# Patient Record
Sex: Male | Born: 1955
Health system: Southern US, Community
[De-identification: ages and names within clinical notes are randomized; demographics above are authoritative.]

## PROBLEM LIST (undated history)

## (undated) DIAGNOSIS — I2510 Atherosclerotic heart disease of native coronary artery without angina pectoris: Secondary | ICD-10-CM

## (undated) DIAGNOSIS — I5030 Unspecified diastolic (congestive) heart failure: Secondary | ICD-10-CM

## (undated) DIAGNOSIS — I700 Atherosclerosis of aorta: Secondary | ICD-10-CM

## (undated) DIAGNOSIS — I4819 Other persistent atrial fibrillation: Secondary | ICD-10-CM

## (undated) DIAGNOSIS — E785 Hyperlipidemia, unspecified: Secondary | ICD-10-CM

## (undated) DIAGNOSIS — I10 Essential (primary) hypertension: Secondary | ICD-10-CM

## (undated) DIAGNOSIS — R7303 Prediabetes: Secondary | ICD-10-CM

## (undated) HISTORY — DX: Unspecified diastolic (congestive) heart failure: I50.30

## (undated) HISTORY — PX: NO PAST SURGERIES: SHX2092

## (undated) HISTORY — DX: Atherosclerosis of aorta: I70.0

## (undated) HISTORY — DX: Prediabetes: R73.03

## (undated) HISTORY — PX: OTHER SURGICAL HISTORY: SHX169

## (undated) HISTORY — DX: Atherosclerotic heart disease of native coronary artery without angina pectoris: I25.10

## (undated) HISTORY — DX: Other persistent atrial fibrillation: I48.19

## (undated) HISTORY — DX: Hyperlipidemia, unspecified: E78.5

---

## 2013-09-11 DIAGNOSIS — Z8673 Personal history of transient ischemic attack (TIA), and cerebral infarction without residual deficits: Secondary | ICD-10-CM

## 2013-09-11 HISTORY — DX: Personal history of transient ischemic attack (TIA), and cerebral infarction without residual deficits: Z86.73

## 2014-09-06 ENCOUNTER — Encounter (HOSPITAL_COMMUNITY): Payer: Self-pay | Admitting: Radiology

## 2014-09-06 ENCOUNTER — Emergency Department (HOSPITAL_COMMUNITY): Payer: BC Managed Care – PPO

## 2014-09-06 ENCOUNTER — Inpatient Hospital Stay (HOSPITAL_COMMUNITY)
Admission: EM | Admit: 2014-09-06 | Discharge: 2014-09-07 | DRG: 069 | Disposition: A | Discharge status: Home / Self Care | Payer: BC Managed Care – PPO | Attending: Internal Medicine | Admitting: Internal Medicine

## 2014-09-06 ENCOUNTER — Inpatient Hospital Stay (HOSPITAL_COMMUNITY): Payer: BC Managed Care – PPO

## 2014-09-06 DIAGNOSIS — R29810 Facial weakness: Secondary | ICD-10-CM | POA: Diagnosis present

## 2014-09-06 DIAGNOSIS — G459 Transient cerebral ischemic attack, unspecified: Principal | ICD-10-CM | POA: Diagnosis present

## 2014-09-06 DIAGNOSIS — G450 Vertebro-basilar artery syndrome: Secondary | ICD-10-CM | POA: Diagnosis not present

## 2014-09-06 DIAGNOSIS — I639 Cerebral infarction, unspecified: Secondary | ICD-10-CM

## 2014-09-06 DIAGNOSIS — I10 Essential (primary) hypertension: Secondary | ICD-10-CM | POA: Diagnosis present

## 2014-09-06 DIAGNOSIS — R059 Cough, unspecified: Secondary | ICD-10-CM

## 2014-09-06 DIAGNOSIS — Z7982 Long term (current) use of aspirin: Secondary | ICD-10-CM | POA: Diagnosis not present

## 2014-09-06 DIAGNOSIS — R42 Dizziness and giddiness: Secondary | ICD-10-CM

## 2014-09-06 DIAGNOSIS — G4700 Insomnia, unspecified: Secondary | ICD-10-CM | POA: Diagnosis present

## 2014-09-06 DIAGNOSIS — J069 Acute upper respiratory infection, unspecified: Secondary | ICD-10-CM | POA: Diagnosis present

## 2014-09-06 DIAGNOSIS — E663 Overweight: Secondary | ICD-10-CM | POA: Diagnosis present

## 2014-09-06 DIAGNOSIS — Z6829 Body mass index (BMI) 29.0-29.9, adult: Secondary | ICD-10-CM

## 2014-09-06 DIAGNOSIS — E785 Hyperlipidemia, unspecified: Secondary | ICD-10-CM | POA: Diagnosis present

## 2014-09-06 DIAGNOSIS — H8110 Benign paroxysmal vertigo, unspecified ear: Secondary | ICD-10-CM | POA: Diagnosis present

## 2014-09-06 DIAGNOSIS — E876 Hypokalemia: Secondary | ICD-10-CM | POA: Diagnosis present

## 2014-09-06 DIAGNOSIS — I5032 Chronic diastolic (congestive) heart failure: Secondary | ICD-10-CM | POA: Diagnosis present

## 2014-09-06 DIAGNOSIS — R05 Cough: Secondary | ICD-10-CM

## 2014-09-06 HISTORY — DX: Essential (primary) hypertension: I10

## 2014-09-06 LAB — DIFFERENTIAL
BASOS ABS: 0 10*3/uL (ref 0.0–0.1)
Basophils Relative: 0 % (ref 0–1)
EOS PCT: 6 % — AB (ref 0–5)
Eosinophils Absolute: 0.7 10*3/uL (ref 0.0–0.7)
LYMPHS ABS: 4.4 10*3/uL — AB (ref 0.7–4.0)
Lymphocytes Relative: 39 % (ref 12–46)
MONOS PCT: 9 % (ref 3–12)
Monocytes Absolute: 1 10*3/uL (ref 0.1–1.0)
NEUTROS ABS: 5.1 10*3/uL (ref 1.7–7.7)
Neutrophils Relative %: 46 % (ref 43–77)

## 2014-09-06 LAB — CBC
HCT: 45.4 % (ref 39.0–52.0)
Hemoglobin: 15.3 g/dL (ref 13.0–17.0)
MCH: 29.3 pg (ref 26.0–34.0)
MCHC: 33.7 g/dL (ref 30.0–36.0)
MCV: 86.8 fL (ref 78.0–100.0)
Platelets: 233 10*3/uL (ref 150–400)
RBC: 5.23 MIL/uL (ref 4.22–5.81)
RDW: 14.1 % (ref 11.5–15.5)
WBC: 11.2 10*3/uL — AB (ref 4.0–10.5)

## 2014-09-06 LAB — I-STAT CHEM 8, ED
BUN: 25 mg/dL — ABNORMAL HIGH (ref 6–23)
CREATININE: 1.1 mg/dL (ref 0.50–1.35)
Calcium, Ion: 1.17 mmol/L (ref 1.12–1.23)
Chloride: 102 mEq/L (ref 96–112)
Glucose, Bld: 164 mg/dL — ABNORMAL HIGH (ref 70–99)
HCT: 49 % (ref 39.0–52.0)
Hemoglobin: 16.7 g/dL (ref 13.0–17.0)
Potassium: 3 mmol/L — ABNORMAL LOW (ref 3.5–5.1)
Sodium: 141 mmol/L (ref 135–145)
TCO2: 22 mmol/L (ref 0–100)

## 2014-09-06 LAB — COMPREHENSIVE METABOLIC PANEL
ALT: 29 U/L (ref 0–53)
ANION GAP: 13 (ref 5–15)
AST: 36 U/L (ref 0–37)
Albumin: 4.5 g/dL (ref 3.5–5.2)
Alkaline Phosphatase: 66 U/L (ref 39–117)
BILIRUBIN TOTAL: 0.6 mg/dL (ref 0.3–1.2)
BUN: 20 mg/dL (ref 6–23)
CHLORIDE: 105 meq/L (ref 96–112)
CO2: 20 mmol/L (ref 19–32)
CREATININE: 1.19 mg/dL (ref 0.50–1.35)
Calcium: 9.7 mg/dL (ref 8.4–10.5)
GFR, EST AFRICAN AMERICAN: 76 mL/min — AB (ref >90)
GFR, EST NON AFRICAN AMERICAN: 65 mL/min — AB (ref >90)
GLUCOSE: 165 mg/dL — AB (ref 70–99)
Potassium: 3.2 mmol/L — ABNORMAL LOW (ref 3.5–5.1)
Sodium: 138 mmol/L (ref 135–145)
Total Protein: 6.7 g/dL (ref 6.0–8.3)

## 2014-09-06 LAB — I-STAT TROPONIN, ED: Troponin i, poc: 0 ng/mL (ref 0.00–0.08)

## 2014-09-06 LAB — URINALYSIS, ROUTINE W REFLEX MICROSCOPIC
Bilirubin Urine: NEGATIVE
GLUCOSE, UA: NEGATIVE mg/dL
Hgb urine dipstick: NEGATIVE
Ketones, ur: NEGATIVE mg/dL
Leukocytes, UA: NEGATIVE
Nitrite: NEGATIVE
Protein, ur: NEGATIVE mg/dL
UROBILINOGEN UA: 0.2 mg/dL (ref 0.0–1.0)
pH: 7 (ref 5.0–8.0)

## 2014-09-06 LAB — RAPID URINE DRUG SCREEN, HOSP PERFORMED
Amphetamines: NOT DETECTED
Barbiturates: NOT DETECTED
Benzodiazepines: NOT DETECTED
Cocaine: NOT DETECTED
Opiates: NOT DETECTED
Tetrahydrocannabinol: NOT DETECTED

## 2014-09-06 LAB — TROPONIN I: Troponin I: <0.03 ng/mL (ref <0.031)

## 2014-09-06 LAB — PROTIME-INR
INR: 0.94 (ref 0.00–1.49)
Prothrombin Time: 12.7 seconds (ref 11.6–15.2)

## 2014-09-06 LAB — ETHANOL

## 2014-09-06 LAB — APTT: aPTT: 26 seconds (ref 24–37)

## 2014-09-06 MED ORDER — POTASSIUM CHLORIDE 10 MEQ/100ML IV SOLN
10.0000 meq | INTRAVENOUS | Status: AC
  Administered 2014-09-06 (×3): 10 meq via INTRAVENOUS
  Filled 2014-09-06 (×3): qty 100

## 2014-09-06 MED ORDER — ALTEPLASE (STROKE) FULL DOSE INFUSION
90.0000 mg | Freq: Once | INTRAVENOUS | Status: DC
  Filled 2014-09-06: qty 90

## 2014-09-06 MED ORDER — ATORVASTATIN CALCIUM 10 MG PO TABS
10.0000 mg | ORAL_TABLET | Freq: Every day | ORAL | Status: DC
  Administered 2014-09-06 – 2014-09-07 (×2): 10 mg via ORAL
  Filled 2014-09-06 (×2): qty 1

## 2014-09-06 MED ORDER — IOHEXOL 350 MG/ML SOLN: 100.0000 mL | Freq: Once | INTRAVENOUS | Status: DC | PRN

## 2014-09-06 MED ORDER — ONDANSETRON HCL 4 MG/2ML IJ SOLN
4.0000 mg | Freq: Four times a day (QID) | INTRAMUSCULAR | Status: DC | PRN
  Administered 2014-09-06: 4 mg via INTRAVENOUS
  Filled 2014-09-06: qty 2

## 2014-09-06 MED ORDER — ONDANSETRON HCL 4 MG/2ML IJ SOLN
4.0000 mg | Freq: Once | INTRAMUSCULAR | Status: AC
  Administered 2014-09-06: 4 mg via INTRAVENOUS

## 2014-09-06 MED ORDER — HYDRALAZINE HCL 20 MG/ML IJ SOLN: 10.0000 mg | Freq: Three times a day (TID) | INTRAMUSCULAR | Status: DC | PRN

## 2014-09-06 MED ORDER — ONDANSETRON HCL 4 MG/2ML IJ SOLN
2.0000 mg | Freq: Once | INTRAMUSCULAR | Status: AC
  Administered 2014-09-06: 2 mg via INTRAVENOUS
  Filled 2014-09-06: qty 2

## 2014-09-06 MED ORDER — ASPIRIN 300 MG RE SUPP: 300.0000 mg | Freq: Every day | RECTAL | Status: DC

## 2014-09-06 MED ORDER — ONDANSETRON HCL 4 MG/2ML IJ SOLN
INTRAMUSCULAR | Status: AC
  Filled 2014-09-06: qty 2

## 2014-09-06 MED ORDER — ASPIRIN 325 MG PO TABS
325.0000 mg | ORAL_TABLET | Freq: Every day | ORAL | Status: DC
  Administered 2014-09-06 – 2014-09-07 (×2): 325 mg via ORAL
  Filled 2014-09-06 (×2): qty 1

## 2014-09-06 MED ORDER — DIAZEPAM 5 MG/ML IJ SOLN: 5.0000 mg | Freq: Once | INTRAMUSCULAR | Status: DC

## 2014-09-06 MED ORDER — ENOXAPARIN SODIUM 40 MG/0.4ML ~~LOC~~ SOLN
40.0000 mg | SUBCUTANEOUS | Status: DC
  Administered 2014-09-06: 40 mg via SUBCUTANEOUS
  Filled 2014-09-06: qty 0.4

## 2014-09-06 MED ORDER — STROKE: EARLY STAGES OF RECOVERY BOOK
Freq: Once | Status: AC
  Administered 2014-09-06: 15:00:00
  Filled 2014-09-06: qty 1

## 2014-09-06 MED ORDER — SENNOSIDES-DOCUSATE SODIUM 8.6-50 MG PO TABS: 1.0000 | ORAL_TABLET | Freq: Every evening | ORAL | Status: DC | PRN

## 2014-09-06 MED ORDER — SODIUM CHLORIDE 0.9 % IV SOLN
INTRAVENOUS | Status: DC
  Administered 2014-09-06 – 2014-09-07 (×4): via INTRAVENOUS

## 2014-09-06 NOTE — ED Notes (Signed)
Report given to Marylene LandAngela, RN on 4N

## 2014-09-06 NOTE — ED Notes (Signed)
Dr Amada Jupiterkirkpatrick discussing options for TPA with pt and wife, pt states would like to go ahead with TPA, wife is unsure.

## 2014-09-06 NOTE — ED Notes (Signed)
Per Admitting Dr. Sunnie Nielsenegalado Pt allowed to drink and eat heart healthy diet and oral potassium (40 meq) if pass swallow screen.

## 2014-09-06 NOTE — ED Notes (Signed)
Admitting at bedside 

## 2014-09-06 NOTE — ED Provider Notes (Signed)
CSN: [id number]     Arrival date & time 09/06/14  1038 History   First MD Initiated Contact with Patient 09/06/14 1043     Chief Complaint  Patient presents with  . Dizziness     (Consider location/radiation/quality/duration/timing/severity/associated sxs/prior Treatment) Patient is a 58 y.o. male presenting with dizziness.  Dizziness Quality:  Room spinning Severity:  Severe Onset quality:  Sudden Duration:  1 hour Timing:  Constant Progression:  Unchanged Chronicity:  Recurrent (a few mild episodes over past month lasting just a few minutes at a time. ) Context comment:  Finished a workout and was in the shower.  He thinks symptoms started when he turned his head. Relieved by:  Being still Worsened by:  Movement Ineffective treatments:  None tried Associated symptoms: nausea and vomiting   Associated symptoms: no chest pain and no shortness of breath   Associated symptoms comment:  Diaphoresis   No past medical history on file. No past surgical history on file. No family history on file. History  Substance Use Topics  . Smoking status: Not on file  . Smokeless tobacco: Not on file  . Alcohol Use: Not on file    Review of Systems  Respiratory: Negative for shortness of breath.   Cardiovascular: Negative for chest pain.  Gastrointestinal: Positive for nausea and vomiting.  Neurological: Positive for dizziness.  All other systems reviewed and are negative.     Allergies  Review of patient's allergies indicates not on file.  Home Medications   Prior to Admission medications   Not on File   There were no vitals taken for this visit. Physical Exam  Constitutional: He is oriented to person, place, and time. He appears well-developed and well-nourished. No distress.  HENT:  Head: Normocephalic and atraumatic.  Mouth/Throat: Oropharynx is clear and moist.  Eyes: Conjunctivae are normal. Pupils are equal, round, and reactive to light. No scleral icterus.  Neck:  Neck supple.  Cardiovascular: Normal rate, regular rhythm, normal heart sounds and intact distal pulses.   No murmur heard. Pulmonary/Chest: Effort normal and breath sounds normal. No stridor. No respiratory distress. He has no wheezes. He has no rales.  Abdominal: Soft. He exhibits no distension. There is no tenderness.  Musculoskeletal: Normal range of motion. He exhibits no edema.  Neurological: He is alert and oriented to person, place, and time. He has normal strength. No cranial nerve deficit or sensory deficit. Abnormal gait: unable to test due to symptoms. GCS eye subscore is 4. GCS verbal subscore is 5. GCS motor subscore is 6.  Severe nystagmus  Skin: Skin is warm and dry. No rash noted.  Psychiatric: He has a normal mood and affect. His behavior is normal.  Nursing note and vitals reviewed.   ED Course  CRITICAL CARE Performed by: Warnell Forester Authorized by: Warnell Forester Total critical care time: 45 minutes Critical care time was exclusive of separately billable procedures and treating other patients. Critical care was necessary to treat or prevent imminent or life-threatening deterioration of the following conditions: CNS failure or compromise. Critical care was time spent personally by me on the following activities: development of treatment plan with patient or surrogate, discussions with consultants, evaluation of patient's response to treatment, examination of patient, obtaining history from patient or surrogate, ordering and performing treatments and interventions, ordering and review of laboratory studies, ordering and review of radiographic studies, pulse oximetry, re-evaluation of patient's condition and review of old charts.   (including critical care time) Labs Review Labs Reviewed  CBC - Abnormal; Notable for the following:    WBC 11.2 (*)    All other components within normal limits  DIFFERENTIAL - Abnormal; Notable for the following:    Eosinophils Relative 6  (*)    Lymphs Abs 4.4 (*)    All other components within normal limits  COMPREHENSIVE METABOLIC PANEL - Abnormal; Notable for the following:    Potassium 3.2 (*)    Glucose, Bld 165 (*)    GFR calc non Af Amer 65 (*)    GFR calc Af Amer 76 (*)    All other components within normal limits  I-STAT CHEM 8, ED - Abnormal; Notable for the following:    Potassium 3.0 (*)    BUN 25 (*)    Glucose, Bld 164 (*)    All other components within normal limits  ETHANOL  PROTIME-INR  APTT  TROPONIN I  URINE RAPID DRUG SCREEN (HOSP PERFORMED)  URINALYSIS, ROUTINE W REFLEX MICROSCOPIC  TROPONIN I  TROPONIN I  HEMOGLOBIN A1C  LIPID PANEL  I-STAT TROPOININ, ED  I-STAT TROPOININ, ED    Imaging Review Ct Angio Head W/cm &/or Wo Cm  09/06/2014   CLINICAL DATA:  58 year old male code stroke. Clinical findings suggest right side brainstem/lateral medullary lesion. Initial encounter.  EXAM: CT ANGIOGRAPHY HEAD AND NECK  TECHNIQUE: Multidetector CT imaging of the head and neck was performed using the standard protocol during bolus administration of intravenous contrast. Multiplanar CT image reconstructions and MIPs were obtained to evaluate the vascular anatomy. Carotid stenosis measurements (when applicable) are obtained utilizing NASCET criteria, using the distal internal carotid diameter as the denominator.  CONTRAST:  100 mL Omnipaque 350.  COMPARISON:  Head CT without contrast 1106 hr the same day.  FINDINGS: CTA NECK  Mild dependent atelectasis in the lung apices. No superior mediastinal lymphadenopathy. Thyroid, larynx, pharynx (retained secretions), parapharyngeal spaces, retropharyngeal space, sublingual space, submandibular glands, parotid glands, and visible orbits soft tissues are within normal limits. Degenerative changes in the cervical spine. No acute osseous abnormality identified. Visualized paranasal sinuses and mastoids are clear. No cervical lymphadenopathy.  Aortic arch: 3 vessel arch  configuration with minimal calcified plaque.  Right carotid system: Negative except for minimal soft and calcified plaque at the right ICA origin and bulb.  Left carotid system: Negative except for minimal to mild calcified plaque at the left CCA and ICA origins. No stenosis.  Vertebral arteries:  No proximal right subclavian artery stenosis. Right vertebral artery origin is within normal limits. The right vertebral artery is mildly non dominant. There is subtle decreased density in both vertebral arteries at the thoracic inlet level felt to be related to quantum mottle artifact. No vessel irregularity or intimal flap, and otherwise normal enhancement of the right vertebral artery to the skullbase.  Mild proximal left subclavian artery calcified plaque with no stenosis. Dominant left vertebral artery within normal origin. Minimal plaque in the left V3 segment, otherwise negative left vertebral artery to the skullbase.  CTA HEAD  Anterior circulation: Both ICA siphons are patent. No plaque or stenosis identified. Ophthalmic artery origins are within normal limits. Carotid termini are patent.  Normal MCA and right ACA origins. Diminutive non dominant left ACA A1 segment. Anterior communicating artery within normal limits. A small median artery of the corpus callosum is present, and otherwise the proximal ACA is have an azygos type appearance. Bilateral ACA branches are within normal limits.  Left MCA branches are within normal limits. Right MCA branches are within normal  limits.  Posterior circulation: Dominant distal left vertebral artery. No distal vertebral artery irregularity or stenosis. Both PICA origins are patent (series 5, image 248. Proximal PICA vessels appear within normal limits.  Patent vertebrobasilar junction. No basilar artery stenosis. SCA and PCA origins are normal. Bilateral PCA branches are within normal limits. Posterior communicating arteries are diminutive or absent.  Venous sinuses: Within  normal limits.  Anatomic variants: Mildly non dominant right vertebral artery. Azygos type proximal ACA anatomy.  IMPRESSION: Mild atherosclerosis with no extra- or intracranial arterial stenosis or occlusion identified. Non dominant right vertebral artery, but normal appearing right PICA.  Study discussed by telephone with Dr. Ritta SlotMcNeill Kirkpatrick at 1140 hr on 09/06/2014.   Electronically Signed   By: Augusto GambleLee  Hall M.D.   On: 09/06/2014 11:50   Dg Chest 2 View  09/06/2014   CLINICAL DATA:  Chronic dizziness for 1 month.  EXAM: CHEST - 2 VIEW  COMPARISON:  None.  FINDINGS: Low lung volumes with borderline cardiac enlargement. Normal vascularity without CHF, pneumonia, collapse or consolidation. No edema, effusion or pneumothorax. Trachea midline. Atherosclerosis of the aorta.  IMPRESSION: Low volume exam with mild cardiac enlargement. No acute chest finding.   Electronically Signed   By: Ruel Favorsrevor  Shick M.D.   On: 09/06/2014 17:14   Ct Head Wo Contrast  09/06/2014   ADDENDUM REPORT: 09/06/2014 11:51  ADDENDUM: Study discussed by telephone with Dr. Ritta SlotMcNeill Kirkpatrick on 09/06/2014 at 1116 hrs.   Electronically Signed   By: Augusto GambleLee  Hall M.D.   On: 09/06/2014 11:51   09/06/2014   CLINICAL DATA:  58 year old male code stroke. Vertigo and diaphoresis. Initial encounter.  EXAM: CT HEAD WITHOUT CONTRAST  TECHNIQUE: Contiguous axial images were obtained from the base of the skull through the vertex without intravenous contrast.  COMPARISON:  None.  FINDINGS: Trace paranasal sinus mucosal thickening. Mastoids are clear. No acute osseous abnormality identified. Visualized orbits and scalp soft tissues are within normal limits.  Cerebral volume is normal. No midline shift, mass effect, or evidence of intracranial mass lesion. No ventriculomegaly.  Some dural calcifications are noted along the falx. No suspicious intracranial vascular hyperdensity.  Mild asymmetry along the left sylvian fissure, anterior circulation  gray-white matter differentiation appears within normal limits. Questionable loss of gray-white matter differentiation in the left occipital lobe, series 2, image 16). Elsewhere posterior circulation gray-white matter differentiation is within normal limits. No acute intracranial hemorrhage identified.  IMPRESSION: 1. Questionable early loss of gray-white matter differentiation in the left occipital lobe such as due to acute left PCA territory in part. 2. Elsewhere normal non contrast CT appearance of the brain.  Electronically Signed: By: Augusto GambleLee  Hall M.D. On: 09/06/2014 11:14   Ct Angio Neck W/cm &/or Wo/cm  09/06/2014   CLINICAL DATA:  58 year old male code stroke. Clinical findings suggest right side brainstem/lateral medullary lesion. Initial encounter.  EXAM: CT ANGIOGRAPHY HEAD AND NECK  TECHNIQUE: Multidetector CT imaging of the head and neck was performed using the standard protocol during bolus administration of intravenous contrast. Multiplanar CT image reconstructions and MIPs were obtained to evaluate the vascular anatomy. Carotid stenosis measurements (when applicable) are obtained utilizing NASCET criteria, using the distal internal carotid diameter as the denominator.  CONTRAST:  100 mL Omnipaque 350.  COMPARISON:  Head CT without contrast 1106 hr the same day.  FINDINGS: CTA NECK  Mild dependent atelectasis in the lung apices. No superior mediastinal lymphadenopathy. Thyroid, larynx, pharynx (retained secretions), parapharyngeal spaces, retropharyngeal space, sublingual space, submandibular glands,  parotid glands, and visible orbits soft tissues are within normal limits. Degenerative changes in the cervical spine. No acute osseous abnormality identified. Visualized paranasal sinuses and mastoids are clear. No cervical lymphadenopathy.  Aortic arch: 3 vessel arch configuration with minimal calcified plaque.  Right carotid system: Negative except for minimal soft and calcified plaque at the right ICA  origin and bulb.  Left carotid system: Negative except for minimal to mild calcified plaque at the left CCA and ICA origins. No stenosis.  Vertebral arteries:  No proximal right subclavian artery stenosis. Right vertebral artery origin is within normal limits. The right vertebral artery is mildly non dominant. There is subtle decreased density in both vertebral arteries at the thoracic inlet level felt to be related to quantum mottle artifact. No vessel irregularity or intimal flap, and otherwise normal enhancement of the right vertebral artery to the skullbase.  Mild proximal left subclavian artery calcified plaque with no stenosis. Dominant left vertebral artery within normal origin. Minimal plaque in the left V3 segment, otherwise negative left vertebral artery to the skullbase.  CTA HEAD  Anterior circulation: Both ICA siphons are patent. No plaque or stenosis identified. Ophthalmic artery origins are within normal limits. Carotid termini are patent.  Normal MCA and right ACA origins. Diminutive non dominant left ACA A1 segment. Anterior communicating artery within normal limits. A small median artery of the corpus callosum is present, and otherwise the proximal ACA is have an azygos type appearance. Bilateral ACA branches are within normal limits.  Left MCA branches are within normal limits. Right MCA branches are within normal limits.  Posterior circulation: Dominant distal left vertebral artery. No distal vertebral artery irregularity or stenosis. Both PICA origins are patent (series 5, image 248. Proximal PICA vessels appear within normal limits.  Patent vertebrobasilar junction. No basilar artery stenosis. SCA and PCA origins are normal. Bilateral PCA branches are within normal limits. Posterior communicating arteries are diminutive or absent.  Venous sinuses: Within normal limits.  Anatomic variants: Mildly non dominant right vertebral artery. Azygos type proximal ACA anatomy.  IMPRESSION: Mild  atherosclerosis with no extra- or intracranial arterial stenosis or occlusion identified. Non dominant right vertebral artery, but normal appearing right PICA.  Study discussed by telephone with Dr. Ritta Slot at 1140 hr on 09/06/2014.   Electronically Signed   By: Augusto Gamble M.D.   On: 09/06/2014 11:50   Above radiology studies independently viewed by me.      EKG Interpretation   Date/Time:  Sunday September 06 2014 10:38:34 EST Ventricular Rate:  78 PR Interval:  199 QRS Duration: 109 QT Interval:  412 QTC Calculation: 469 R Axis:   60 Text Interpretation:  Age not entered, assumed to be  58 years old for  purpose of ECG interpretation Sinus rhythm Probable left atrial  enlargement No old tracing to compare Confirmed by [facility]  MD, TREY  ([id number]) on 09/06/2014 10:53:19 AM      MDM   Final diagnoses:  Vertigo  CVA  58 yo male presenting with sudden onset room spinning dizziness.  Ill appearing on arrival, diaphoretic, vomiting, severe nystagmus.  Code stroke called as time since onset of symptoms was less than one hour.  CT scan showed possible CVA but not hemorrhage.  tPA was offered, but family and patient elected to not proceed with this treatment.  He was subsequently admitted for further stroke workup.      Warnell Forester, MD 09/06/14 [id number]

## 2014-09-06 NOTE — ED Notes (Signed)
Dr Amada JupiterKirkpatrick in to see pt in CT

## 2014-09-06 NOTE — H&P (Addendum)
[facility] History and Physical  Timothy MaroonClaude Schreur HQI:[id number]:[id number] DOB: 1956-02-11 DOA: 09/06/2014  Referring physician: Dr Loretha StaplerWofford. PCP: No primary care provider on file.   Chief Complaint: dizziness.   HPI: Timothy Cooley is a 58 y.o. male with PMH significant for HTN, hyperlipidemia, who presents complaining of sudden onset dizziness, nausea and episode of vomiting that started around 10; am while he was taking a shower. The dizziness persisted on arrival to ED. He was also notice to have mild right facial droop and slurred speech. Code stroke was activated. Neurologist evaluated patient and recommended TPA. Patient and family decline TPA. Patient neuro deficit has improved. Dizziness has resolved, speech is clear.  Patient denies chest pain, dyspnea, diarrhea.  Of note, patient report two prior dizzy spell over last months. The first one was Dec 5 and second episode Dec 23. Both episodes resolved quickly.  He has not been taking an aspirin.   Review of Systems:    History reviewed. No pertinent past medical history. No past surgical history on file. Social History:  has no tobacco, alcohol, and drug history on file.  No Known Allergies  No family history on file.   Prior to Admission medications   Medication Sig Start Date End Date Taking? Authorizing Provider  atorvastatin (LIPITOR) 10 MG tablet Take 10 mg by mouth daily.   Yes Historical Provider, MD  hydrochlorothiazide (HYDRODIURIL) 12.5 MG tablet Take 12.5 mg by mouth daily.   Yes Historical Provider, MD  Naproxen Sod-Diphenhydramine (ALEVE PM PO) Take 2 tablets by mouth every evening.   Yes Historical Provider, MD   Physical Exam: Filed Vitals:   09/06/14 1146 09/06/14 1200 09/06/14 1230 09/06/14 1315  BP:  144/81 125/76 123/64  Pulse:  61 63 56  Temp:      TempSrc:      Resp:  16  10  Height: 6\' 1"  (1.854 m)     Weight: 102.059 kg (225 lb)     SpO2:  99% 98% 100%    Wt Readings from Last 3 Encounters:    09/06/14 102.059 kg (225 lb)    General:  Appears calm and comfortable Eyes: PERRL, normal lids, irises & conjunctiva ENT: grossly normal hearing, lips & tongue Neck: no LAD, masses or thyromegaly Cardiovascular: RRR, no m/r/g. No LE edema. Telemetry: SR, no arrhythmias  Respiratory: CTA bilaterally, no w/r/r. Normal respiratory effort. Abdomen: soft, ntnd Skin: no rash or induration seen on limited exam Musculoskeletal: grossly normal tone BUE/BLE Psychiatric: grossly normal mood and affect, speech fluent and appropriate Neurologic: grossly non-focal. Speech is clear, motor strength 5/5/           Labs on Admission:  Basic Metabolic Panel:  Recent Labs Lab 09/06/14 1050 09/06/14 1100  NA 138 141  K 3.2* 3.0*  CL 105 102  CO2 20  --   GLUCOSE 165* 164*  BUN 20 25*  CREATININE 1.19 1.10  CALCIUM 9.7  --    Liver Function Tests:  Recent Labs Lab 09/06/14 1050  AST 36  ALT 29  ALKPHOS 66  BILITOT 0.6  PROT 6.7  ALBUMIN 4.5   No results for input(s): LIPASE, AMYLASE in the last 168 hours. No results for input(s): AMMONIA in the last 168 hours. CBC:  Recent Labs Lab 09/06/14 1050 09/06/14 1100  WBC 11.2*  --   NEUTROABS 5.1  --   HGB 15.3 16.7  HCT 45.4 49.0  MCV 86.8  --   PLT 233  --  Cardiac Enzymes: No results for input(s): CKTOTAL, CKMB, CKMBINDEX, TROPONINI in the last 168 hours.  BNP (last 3 results) No results for input(s): PROBNP in the last 8760 hours. CBG: No results for input(s): GLUCAP in the last 168 hours.  Radiological Exams on Admission: Ct Angio Head W/cm &/or Wo Cm  09/06/2014   CLINICAL DATA:  58 year old male code stroke. Clinical findings suggest right side brainstem/lateral medullary lesion. Initial encounter.  EXAM: CT ANGIOGRAPHY HEAD AND NECK  TECHNIQUE: Multidetector CT imaging of the head and neck was performed using the standard protocol during bolus administration of intravenous contrast. Multiplanar CT image  reconstructions and MIPs were obtained to evaluate the vascular anatomy. Carotid stenosis measurements (when applicable) are obtained utilizing NASCET criteria, using the distal internal carotid diameter as the denominator.  CONTRAST:  100 mL Omnipaque 350.  COMPARISON:  Head CT without contrast 1106 hr the same day.  FINDINGS: CTA NECK  Mild dependent atelectasis in the lung apices. No superior mediastinal lymphadenopathy. Thyroid, larynx, pharynx (retained secretions), parapharyngeal spaces, retropharyngeal space, sublingual space, submandibular glands, parotid glands, and visible orbits soft tissues are within normal limits. Degenerative changes in the cervical spine. No acute osseous abnormality identified. Visualized paranasal sinuses and mastoids are clear. No cervical lymphadenopathy.  Aortic arch: 3 vessel arch configuration with minimal calcified plaque.  Right carotid system: Negative except for minimal soft and calcified plaque at the right ICA origin and bulb.  Left carotid system: Negative except for minimal to mild calcified plaque at the left CCA and ICA origins. No stenosis.  Vertebral arteries:  No proximal right subclavian artery stenosis. Right vertebral artery origin is within normal limits. The right vertebral artery is mildly non dominant. There is subtle decreased density in both vertebral arteries at the thoracic inlet level felt to be related to quantum mottle artifact. No vessel irregularity or intimal flap, and otherwise normal enhancement of the right vertebral artery to the skullbase.  Mild proximal left subclavian artery calcified plaque with no stenosis. Dominant left vertebral artery within normal origin. Minimal plaque in the left V3 segment, otherwise negative left vertebral artery to the skullbase.  CTA HEAD  Anterior circulation: Both ICA siphons are patent. No plaque or stenosis identified. Ophthalmic artery origins are within normal limits. Carotid termini are patent.  Normal  MCA and right ACA origins. Diminutive non dominant left ACA A1 segment. Anterior communicating artery within normal limits. A small median artery of the corpus callosum is present, and otherwise the proximal ACA is have an azygos type appearance. Bilateral ACA branches are within normal limits.  Left MCA branches are within normal limits. Right MCA branches are within normal limits.  Posterior circulation: Dominant distal left vertebral artery. No distal vertebral artery irregularity or stenosis. Both PICA origins are patent (series 5, image 248. Proximal PICA vessels appear within normal limits.  Patent vertebrobasilar junction. No basilar artery stenosis. SCA and PCA origins are normal. Bilateral PCA branches are within normal limits. Posterior communicating arteries are diminutive or absent.  Venous sinuses: Within normal limits.  Anatomic variants: Mildly non dominant right vertebral artery. Azygos type proximal ACA anatomy.  IMPRESSION: Mild atherosclerosis with no extra- or intracranial arterial stenosis or occlusion identified. Non dominant right vertebral artery, but normal appearing right PICA.  Study discussed by telephone with Dr. Ritta SlotMcNeill Kirkpatrick at 1140 hr on 09/06/2014.   Electronically Signed   By: Augusto GambleLee  Hall M.D.   On: 09/06/2014 11:50   Ct Head Wo Contrast  09/06/2014  ADDENDUM REPORT: 09/06/2014 11:51  ADDENDUM: Study discussed by telephone with Dr. Ritta Slot on 09/06/2014 at 1116 hrs.   Electronically Signed   By: Augusto Gamble M.D.   On: 09/06/2014 11:51   09/06/2014   CLINICAL DATA:  58 year old male code stroke. Vertigo and diaphoresis. Initial encounter.  EXAM: CT HEAD WITHOUT CONTRAST  TECHNIQUE: Contiguous axial images were obtained from the base of the skull through the vertex without intravenous contrast.  COMPARISON:  None.  FINDINGS: Trace paranasal sinus mucosal thickening. Mastoids are clear. No acute osseous abnormality identified. Visualized orbits and scalp soft  tissues are within normal limits.  Cerebral volume is normal. No midline shift, mass effect, or evidence of intracranial mass lesion. No ventriculomegaly.  Some dural calcifications are noted along the falx. No suspicious intracranial vascular hyperdensity.  Mild asymmetry along the left sylvian fissure, anterior circulation gray-white matter differentiation appears within normal limits. Questionable loss of gray-white matter differentiation in the left occipital lobe, series 2, image 16). Elsewhere posterior circulation gray-white matter differentiation is within normal limits. No acute intracranial hemorrhage identified.  IMPRESSION: 1. Questionable early loss of gray-white matter differentiation in the left occipital lobe such as due to acute left PCA territory in part. 2. Elsewhere normal non contrast CT appearance of the brain.  Electronically Signed: By: Augusto Gamble M.D. On: 09/06/2014 11:14   Ct Angio Neck W/cm &/or Wo/cm  09/06/2014   CLINICAL DATA:  58 year old male code stroke. Clinical findings suggest right side brainstem/lateral medullary lesion. Initial encounter.  EXAM: CT ANGIOGRAPHY HEAD AND NECK  TECHNIQUE: Multidetector CT imaging of the head and neck was performed using the standard protocol during bolus administration of intravenous contrast. Multiplanar CT image reconstructions and MIPs were obtained to evaluate the vascular anatomy. Carotid stenosis measurements (when applicable) are obtained utilizing NASCET criteria, using the distal internal carotid diameter as the denominator.  CONTRAST:  100 mL Omnipaque 350.  COMPARISON:  Head CT without contrast 1106 hr the same day.  FINDINGS: CTA NECK  Mild dependent atelectasis in the lung apices. No superior mediastinal lymphadenopathy. Thyroid, larynx, pharynx (retained secretions), parapharyngeal spaces, retropharyngeal space, sublingual space, submandibular glands, parotid glands, and visible orbits soft tissues are within normal limits.  Degenerative changes in the cervical spine. No acute osseous abnormality identified. Visualized paranasal sinuses and mastoids are clear. No cervical lymphadenopathy.  Aortic arch: 3 vessel arch configuration with minimal calcified plaque.  Right carotid system: Negative except for minimal soft and calcified plaque at the right ICA origin and bulb.  Left carotid system: Negative except for minimal to mild calcified plaque at the left CCA and ICA origins. No stenosis.  Vertebral arteries:  No proximal right subclavian artery stenosis. Right vertebral artery origin is within normal limits. The right vertebral artery is mildly non dominant. There is subtle decreased density in both vertebral arteries at the thoracic inlet level felt to be related to quantum mottle artifact. No vessel irregularity or intimal flap, and otherwise normal enhancement of the right vertebral artery to the skullbase.  Mild proximal left subclavian artery calcified plaque with no stenosis. Dominant left vertebral artery within normal origin. Minimal plaque in the left V3 segment, otherwise negative left vertebral artery to the skullbase.  CTA HEAD  Anterior circulation: Both ICA siphons are patent. No plaque or stenosis identified. Ophthalmic artery origins are within normal limits. Carotid termini are patent.  Normal MCA and right ACA origins. Diminutive non dominant left ACA A1 segment. Anterior communicating artery within normal limits.  A small median artery of the corpus callosum is present, and otherwise the proximal ACA is have an azygos type appearance. Bilateral ACA branches are within normal limits.  Left MCA branches are within normal limits. Right MCA branches are within normal limits.  Posterior circulation: Dominant distal left vertebral artery. No distal vertebral artery irregularity or stenosis. Both PICA origins are patent (series 5, image 248. Proximal PICA vessels appear within normal limits.  Patent vertebrobasilar junction.  No basilar artery stenosis. SCA and PCA origins are normal. Bilateral PCA branches are within normal limits. Posterior communicating arteries are diminutive or absent.  Venous sinuses: Within normal limits.  Anatomic variants: Mildly non dominant right vertebral artery. Azygos type proximal ACA anatomy.  IMPRESSION: Mild atherosclerosis with no extra- or intracranial arterial stenosis or occlusion identified. Non dominant right vertebral artery, but normal appearing right PICA.  Study discussed by telephone with Dr. Ritta Slot at 1140 hr on 09/06/2014.   Electronically Signed   By: Augusto Gamble M.D.   On: 09/06/2014 11:50    EKG: Independently reviewed. Sinus rhythm.   Assessment/Plan Principal Problem:   CVA (cerebral infarction) Active Problems:   Stroke   HTN (hypertension)   Hyperlipidemia   Hypokalemia  1-Dizziness, slurred speech, Facial droop; likely stroke vs TIA.  Admit to telemetry, MRI brain ordered. Lipid panel, HA-1c. ECHO.  CT  head with early loss gray white matter.  Permissive hypertension.  Daily aspirin.  PT, OT, Speech.  Check orthostatic vitals. Cycle cardiac enzymes.   2-HTN; permissive HTN. Hold HCTZ.   3-Hyperlipidemia; continue with statins.   4-Hypokalemia; replete oral if pass swallow evaluation.    Code Status: presume full code.  DVT Prophylaxis:lovenox.  Family Communication: care discussed with wife and daughters who were at bedside.  Disposition Plan: expect 2 to 3 days inpatient.   Time spent: 75 minutes.   Hartley Barefoot A [facility] Pager [phone]

## 2014-09-06 NOTE — Consult Note (Signed)
Neurology Consultation Reason for Consult: Stroke Referring Physician: Loretha StaplerWofford, T  CC: Stroke  History is obtained from: Patient, wife  HPI: Timothy Cooley is a 58 y.o. male with a history of hypertension, hyperlipidemia who presents with sudden onset vertigo and unsteady gait that started abruptly at 10 AM. On evaluation in the emergency department, he was found to have some right-sided ataxia, facial droop, slurred speech and TPA was offered. The patient initially wanted to go ahead with that, but after speaking with his wife and daughters decided against proceeding. He also had some numbness of his right side earlier which has improved.   LKW: 10 am  tpa given?: no, patient refusal NIH: 4    ROS: A 14 point ROS was performed and is negative except as noted in the HPI.   Past medical history: Hypertension Hyperlipidemia  Family History: No history of stroke  Social History: Tob: Denies  Exam: Current vital signs: BP 142/86 mmHg  Pulse 67  Temp(Src) 97.5 F (36.4 C) (Oral)  Resp 18  Ht 6\' 1"  (1.854 m)  Wt 102.059 kg (225 lb)  BMI 29.69 kg/m2  SpO2 98% Vital signs in last 24 hours: Temp:  [97.4 F (36.3 C)-97.5 F (36.4 C)] 97.5 F (36.4 C) (12/27 1358) Pulse Rate:  [56-76] 67 (12/27 1717) Resp:  [10-22] 18 (12/27 1717) BP: (123-170)/(64-148) 142/86 mmHg (12/27 1717) SpO2:  [98 %-100 %] 98 % (12/27 1717) Weight:  [102.059 kg (225 lb)] 102.059 kg (225 lb) (12/27 1146)  Physical Exam  Constitutional: Appears well-developed and well-nourished.  Psych: Affect appropriate to situation Eyes: No scleral injection HENT: No OP obstrucion Head: Normocephalic.  Cardiovascular: Normal rate and regular rhythm.  Respiratory: Effort normal and breath sounds normal to anterior ascultation GI: Soft.  No distension. There is no tenderness.  Skin: WDI  Neuro: Mental Status: Patient is awake, alert, oriented to person, place, month, year, and situation. Patient is able to  give a clear and coherent history. No signs of aphasia or neglect He is mildly dysarthric Cranial Nerves: II: Visual Fields are full. Pupils are equal, round, and reactive to light.   III,IV, VI: EOMI without ptosis or diploplia.  V: Facial sensation is symmetric to temperature VII: Facial movement is notable for mild right facial droop VIII: hearing is intact to voice X: Uvula elevates symmetrically XI: Shoulder shrug is symmetric. XII: tongue is midline without atrophy or fasciculations.  Motor: Tone is normal. Bulk is normal. 5/5 strength was present in all four extremities.  Sensory: Sensation is symmetric to light touch and temperature in the arms and legs. Deep Tendon Reflexes: 2+ and symmetric in the biceps and patellae.  Plantars: Toes are downgoing bilaterally.  Cerebellar: FNF and HKS are intact on the left, mildly ataxic in the right arm and leg.     I have reviewed labs in epic and the results pertinent to this consultation are: Hypokalemia  I have reviewed the images obtained: CT head-negative  Impression: 58 year old male presenting with vertigo, right-sided ataxia, facial droop and dysarthria. I suspect that he has had an infarct likely involving brainstem and cerebellar peduncle. He'll need to be admitted for workup, but I suspect small vessel disease. He subsequently did have improvement after his initial examination, with the ataxia essentially resolving.  Recommendations: 1. HgbA1c, fasting lipid panel 2. MRI, MRA  of the brain without contrast 3. Frequent neuro checks 4. Echocardiogram 5. Carotid dopplers 6. Prophylactic therapy-Antiplatelet med: Aspirin - dose 325mg  PO or 300mg  PR 7.  Risk factor modification 8. Telemetry monitoring 9. PT consult, OT consult, Speech consult    Roland Rack, MD [facility] [phone]  If 7pm- 7am, please page neurology on call as listed in [place].

## 2014-09-06 NOTE — ED Notes (Signed)
Acitivated CODE Stroke @ 10:47a

## 2014-09-06 NOTE — Progress Notes (Signed)
[place].

## 2014-09-06 NOTE — ED Notes (Signed)
To ED via private vehicle

## 2014-09-06 NOTE — ED Notes (Signed)
Pt decision is NOT to have TPA at this time.

## 2014-09-07 DIAGNOSIS — G450 Vertebro-basilar artery syndrome: Secondary | ICD-10-CM

## 2014-09-07 DIAGNOSIS — I517 Cardiomegaly: Secondary | ICD-10-CM

## 2014-09-07 DIAGNOSIS — I5032 Chronic diastolic (congestive) heart failure: Secondary | ICD-10-CM

## 2014-09-07 DIAGNOSIS — E663 Overweight: Secondary | ICD-10-CM

## 2014-09-07 DIAGNOSIS — R42 Dizziness and giddiness: Secondary | ICD-10-CM

## 2014-09-07 DIAGNOSIS — E785 Hyperlipidemia, unspecified: Secondary | ICD-10-CM

## 2014-09-07 DIAGNOSIS — G459 Transient cerebral ischemic attack, unspecified: Principal | ICD-10-CM

## 2014-09-07 LAB — LIPID PANEL
CHOL/HDL RATIO: 3.7 ratio
Cholesterol: 179 mg/dL (ref 0–200)
HDL: 49 mg/dL (ref >39)
LDL CALC: 111 mg/dL — AB (ref 0–99)
Triglycerides: 96 mg/dL (ref <150)
VLDL: 19 mg/dL (ref 0–40)

## 2014-09-07 LAB — BRAIN NATRIURETIC PEPTIDE: B NATRIURETIC PEPTIDE 5: 35 pg/mL (ref 0.0–100.0)

## 2014-09-07 LAB — HEMOGLOBIN A1C
Hgb A1c MFr Bld: 6.3 % — ABNORMAL HIGH (ref <5.7)
Mean Plasma Glucose: 134 mg/dL — ABNORMAL HIGH (ref <117)

## 2014-09-07 LAB — TROPONIN I: Troponin I: <0.03 ng/mL (ref <0.031)

## 2014-09-07 MED ORDER — LIVING BETTER WITH HEART FAILURE BOOK
Freq: Once | Status: AC
  Administered 2014-09-07: 1

## 2014-09-07 MED ORDER — LISINOPRIL 5 MG PO TABS: 5.0000 mg | ORAL_TABLET | Freq: Every day | ORAL | Status: DC

## 2014-09-07 MED ORDER — CARVEDILOL 3.125 MG PO TABS: 3.1250 mg | ORAL_TABLET | Freq: Two times a day (BID) | ORAL | Status: DC

## 2014-09-07 MED ORDER — ASPIRIN EC 325 MG PO TBEC: 325.0000 mg | DELAYED_RELEASE_TABLET | Freq: Every day | ORAL | Status: DC

## 2014-09-07 MED ORDER — ATORVASTATIN CALCIUM 40 MG PO TABS: 40.0000 mg | ORAL_TABLET | Freq: Every day | ORAL | Status: DC

## 2014-09-07 MED ORDER — CLOPIDOGREL BISULFATE 75 MG PO TABS
75.0000 mg | ORAL_TABLET | Freq: Every day | ORAL | Status: DC
  Administered 2014-09-07: 75 mg via ORAL
  Filled 2014-09-07: qty 1

## 2014-09-07 NOTE — Progress Notes (Signed)
  Echocardiogram 2D Echocardiogram has been performed.  Timothy Cooley, Timothy Cooley 09/07/2014, 9:45 AM

## 2014-09-07 NOTE — Discharge Summary (Signed)
Discharge Summary  Timothy Cooley ZOX:[id number] DOB: [birth year]  PCP: Enrique Sack, MD  Admit date: 09/06/2014 Discharge date: 09/07/2014  Time spent: 35 minutes  Recommendations for Outpatient Follow-up:  1. New medication: Coreg 3.125 by mouth twice a day 2. New medication: Lisinopril 5 mg by mouth daily 3. New medication: Aspirin 325 mg by mouth daily 4. Medication change: Patient advised to discontinue hydrochlorothiazide 5. Medication change: Lipitor being increased from 10 mg to 40 mg by mouth daily at bedtime 6. Patient advised to follow-up with stroke service in the next 2 months 7. Patient to follow up with PCP in the next one month 8. Patient advised to follow-up with [facility] heart care in the next 1-2 weeks   Discharge Diagnoses:  Active Hospital Problems   Diagnosis Date Noted  . TIA (transient ischemic attack) 09/06/2014  . Overweight 09/07/2014  . Chronic diastolic heart failure 09/06/2014  . HTN (hypertension) 09/06/2014  . Hyperlipidemia 09/06/2014  . Hypokalemia 09/06/2014    Resolved Hospital Problems   Diagnosis Date Noted Date Resolved  No resolved problems to display.    Discharge Condition: Improved, being discharged home  Diet recommendation: Heart healthy  Filed Weights   09/06/14 1146  Weight: 102.059 kg (225 lb)    History of present illness:  Patient is a 58 year old male with past mental history of hyperlipidemia and mild hypertension who was admitted on 12/27 to the hospitalist service after having sudden onset vertigo-like symptoms with associated nausea and vomiting. In the emergency room he had a question of facial droop and there is concern that this was a possibility of sudden onset stroke. Patient was offered TPA but declined.    Hospital Course:  Principal Problem:   TIA (transient ischemic attack): By hospital day 2, symptoms had fully resolved. It was unclear if this is stroke versus benign positional vertigo, however with  other findings, see below, discussed with family and we agreed to proceed as if this was a TIA. Echocardiogram unrevealing for clot. CT angiogram noted no bilateral carotid artery stenosis. MRI/MRA head some motion degradation, but unrevealing. Fasting lipid panel did note elevated LDL of 110, despite being on Lipitor 10 mg. Therefore, Lipitor increased to 40 mg daily. Patient does not smoke. A1c noted patient to be prediabetic at 6.3, not diagnostic of diabetes and patient advised to lose weight for this. He will be started on a daily aspirin 325 mg by mouth daily as he has not ever been on an antiplatelet agent  Active Problems:   Chronic diastolic heart failure: Echocardiogram noted grade 2 diastolic dysfunction. BNP checked and found to be normal. As per protocol, patient started on beta blocker and lisinopril low dose. Hydrochlorothiazide discontinued to avoid hypotension. Because of new finding of grade 2, have recommended patient follow-up with cardiology soon as outpatient.    HTN (hypertension): As above. HCTZ discontinued and starting Coreg and lisinopril.   Hyperlipidemia: LDL goal less than 70, Lipitor increased to 40 mg daily   Hypokalemia: Replaced. Hypokalemia may cause patient has some tingling sensation, would not account for vertigo symptoms.   Overweight: Patient advised to lose weight   Procedures:  2-D echo: Grade 2 diastolic dysfunction  Consultations:  Neurology  Discharge Exam: BP 131/72 mmHg  Pulse 86  Temp(Src) 97.9 F (36.6 C) (Oral)  Resp 20  Ht 6\' 1"  (1.854 m)  Wt 102.059 kg (225 lb)  BMI 29.69 kg/m2  SpO2 98%  General: Alert and oriented 3, no acute distress Cardiovascular: Regular  rate and rhythm, S1-S2 Respiratory: Clear to auscultation bilaterally  Discharge Instructions You were cared for by a hospitalist during your hospital stay. If you have any questions about your discharge medications or the care you received while you were in the hospital  after you are discharged, you can call the unit and asked to speak with the hospitalist on call if the hospitalist that took care of you is not available. Once you are discharged, your primary care physician will handle any further medical issues. Please note that NO REFILLS for any discharge medications will be authorized once you are discharged, as it is imperative that you return to your primary care physician (or establish a relationship with a primary care physician if you do not have one) for your aftercare needs so that they can reassess your need for medications and monitor your lab values.  Discharge Instructions    Ambulatory referral to Neurology    Complete by:  As directed   Dr. Roda Shutters requests followup in 2 months     Diet - low sodium heart healthy    Complete by:  As directed      Increase activity slowly    Complete by:  As directed             Medication List    STOP taking these medications        ALEVE PM PO     hydrochlorothiazide 12.5 MG tablet  Commonly known as:  HYDRODIURIL      TAKE these medications        aspirin EC 325 MG tablet  Take 1 tablet (325 mg total) by mouth daily.     atorvastatin 40 MG tablet  Commonly known as:  LIPITOR  Take 1 tablet (40 mg total) by mouth daily.     carvedilol 3.125 MG tablet  Commonly known as:  COREG  Take 1 tablet (3.125 mg total) by mouth 2 (two) times daily with a meal.     lisinopril 5 MG tablet  Commonly known as:  PRINIVIL,ZESTRIL  Take 1 tablet (5 mg total) by mouth daily.       No Known Allergies     Follow-up Information    Follow up with Xu,Jindong, MD. Schedule an appointment as soon as possible for a visit in 2 months.   Specialty:  Neurology   Why:  Stroke Clinic, Office will call you with appointment date & time   Contact information:   [address] [place] [address] [phone]       Follow up with GREEN, Lorenda Ishihara, MD In 1 month.   Specialty:  Internal Medicine    Contact information:   [address] [place] [address] [phone]       Follow up with [facility]. Schedule an appointment as soon as possible for a visit in 1 week.   Contact information:   [address] [place] [address] [phone]       The results of significant diagnostics from this hospitalization (including imaging, microbiology, ancillary and laboratory) are listed below for reference.    Significant Diagnostic Studies: Ct Angio Head W/cm &/or Wo Cm  09/06/2014   CLINICAL DATA:  58 year old male code stroke. Clinical findings suggest right side brainstem/lateral medullary lesion. Initial encounter.  EXAM: CT ANGIOGRAPHY HEAD AND NECK  TECHNIQUE: Multidetector CT imaging of the head and neck was performed using the standard protocol during bolus administration of intravenous contrast.  Multiplanar CT image reconstructions and MIPs were obtained to evaluate the vascular anatomy. Carotid stenosis measurements (when applicable) are obtained utilizing NASCET criteria, using the distal internal carotid diameter as the denominator.  CONTRAST:  100 mL Omnipaque 350.  COMPARISON:  Head CT without contrast 1106 hr the same day.  FINDINGS: CTA NECK  Mild dependent atelectasis in the lung apices. No superior mediastinal lymphadenopathy. Thyroid, larynx, pharynx (retained secretions), parapharyngeal spaces, retropharyngeal space, sublingual space, submandibular glands, parotid glands, and visible orbits soft tissues are within normal limits. Degenerative changes in the cervical spine. No acute osseous abnormality identified. Visualized paranasal sinuses and mastoids are clear. No cervical lymphadenopathy.  Aortic arch: 3 vessel arch configuration with minimal calcified plaque.  Right carotid system: Negative except for minimal soft and calcified plaque at the right ICA origin and bulb.  Left carotid system: Negative except for minimal to mild calcified plaque  at the left CCA and ICA origins. No stenosis.  Vertebral arteries:  No proximal right subclavian artery stenosis. Right vertebral artery origin is within normal limits. The right vertebral artery is mildly non dominant. There is subtle decreased density in both vertebral arteries at the thoracic inlet level felt to be related to quantum mottle artifact. No vessel irregularity or intimal flap, and otherwise normal enhancement of the right vertebral artery to the skullbase.  Mild proximal left subclavian artery calcified plaque with no stenosis. Dominant left vertebral artery within normal origin. Minimal plaque in the left V3 segment, otherwise negative left vertebral artery to the skullbase.  CTA HEAD  Anterior circulation: Both ICA siphons are patent. No plaque or stenosis identified. Ophthalmic artery origins are within normal limits. Carotid termini are patent.  Normal MCA and right ACA origins. Diminutive non dominant left ACA A1 segment. Anterior communicating artery within normal limits. A small median artery of the corpus callosum is present, and otherwise the proximal ACA is have an azygos type appearance. Bilateral ACA branches are within normal limits.  Left MCA branches are within normal limits. Right MCA branches are within normal limits.  Posterior circulation: Dominant distal left vertebral artery. No distal vertebral artery irregularity or stenosis. Both PICA origins are patent (series 5, image 248. Proximal PICA vessels appear within normal limits.  Patent vertebrobasilar junction. No basilar artery stenosis. SCA and PCA origins are normal. Bilateral PCA branches are within normal limits. Posterior communicating arteries are diminutive or absent.  Venous sinuses: Within normal limits.  Anatomic variants: Mildly non dominant right vertebral artery. Azygos type proximal ACA anatomy.  IMPRESSION: Mild atherosclerosis with no extra- or intracranial arterial stenosis or occlusion identified. Non dominant  right vertebral artery, but normal appearing right PICA.  Study discussed by telephone with Dr. Ritta SlotMcNeill Kirkpatrick at 1140 hr on 09/06/2014.   Electronically Signed   By: Augusto GambleLee  Hall M.D.   On: 09/06/2014 11:50   Dg Chest 2 View  09/06/2014   CLINICAL DATA:  Chronic dizziness for 1 month.  EXAM: CHEST - 2 VIEW  COMPARISON:  None.  FINDINGS: Low lung volumes with borderline cardiac enlargement. Normal vascularity without CHF, pneumonia, collapse or consolidation. No edema, effusion or pneumothorax. Trachea midline. Atherosclerosis of the aorta.  IMPRESSION: Low volume exam with mild cardiac enlargement. No acute chest finding.   Electronically Signed   By: Ruel Favorsrevor  Shick M.D.   On: 09/06/2014 17:14   Ct Head Wo Contrast  09/06/2014   ADDENDUM REPORT: 09/06/2014 11:51  ADDENDUM: Study discussed by telephone with Dr. Ritta SlotMcNeill Kirkpatrick on 09/06/2014 at 1116  hrs.   Electronically Signed   By: Augusto Gamble M.D.   On: 09/06/2014 11:51   09/06/2014   CLINICAL DATA:  58 year old male code stroke. Vertigo and diaphoresis. Initial encounter.  EXAM: CT HEAD WITHOUT CONTRAST  TECHNIQUE: Contiguous axial images were obtained from the base of the skull through the vertex without intravenous contrast.  COMPARISON:  None.  FINDINGS: Trace paranasal sinus mucosal thickening. Mastoids are clear. No acute osseous abnormality identified. Visualized orbits and scalp soft tissues are within normal limits.  Cerebral volume is normal. No midline shift, mass effect, or evidence of intracranial mass lesion. No ventriculomegaly.  Some dural calcifications are noted along the falx. No suspicious intracranial vascular hyperdensity.  Mild asymmetry along the left sylvian fissure, anterior circulation gray-white matter differentiation appears within normal limits. Questionable loss of gray-white matter differentiation in the left occipital lobe, series 2, image 16). Elsewhere posterior circulation gray-white matter differentiation is within  normal limits. No acute intracranial hemorrhage identified.  IMPRESSION: 1. Questionable early loss of gray-white matter differentiation in the left occipital lobe such as due to acute left PCA territory in part. 2. Elsewhere normal non contrast CT appearance of the brain.  Electronically Signed: By: Augusto Gamble M.D. On: 09/06/2014 11:14   Ct Angio Neck W/cm &/or Wo/cm  09/06/2014   CLINICAL DATA:  58 year old male code stroke. Clinical findings suggest right side brainstem/lateral medullary lesion. Initial encounter.  EXAM: CT ANGIOGRAPHY HEAD AND NECK  TECHNIQUE: Multidetector CT imaging of the head and neck was performed using the standard protocol during bolus administration of intravenous contrast. Multiplanar CT image reconstructions and MIPs were obtained to evaluate the vascular anatomy. Carotid stenosis measurements (when applicable) are obtained utilizing NASCET criteria, using the distal internal carotid diameter as the denominator.  CONTRAST:  100 mL Omnipaque 350.  COMPARISON:  Head CT without contrast 1106 hr the same day.  FINDINGS: CTA NECK  Mild dependent atelectasis in the lung apices. No superior mediastinal lymphadenopathy. Thyroid, larynx, pharynx (retained secretions), parapharyngeal spaces, retropharyngeal space, sublingual space, submandibular glands, parotid glands, and visible orbits soft tissues are within normal limits. Degenerative changes in the cervical spine. No acute osseous abnormality identified. Visualized paranasal sinuses and mastoids are clear. No cervical lymphadenopathy.  Aortic arch: 3 vessel arch configuration with minimal calcified plaque.  Right carotid system: Negative except for minimal soft and calcified plaque at the right ICA origin and bulb.  Left carotid system: Negative except for minimal to mild calcified plaque at the left CCA and ICA origins. No stenosis.  Vertebral arteries:  No proximal right subclavian artery stenosis. Right vertebral artery origin is  within normal limits. The right vertebral artery is mildly non dominant. There is subtle decreased density in both vertebral arteries at the thoracic inlet level felt to be related to quantum mottle artifact. No vessel irregularity or intimal flap, and otherwise normal enhancement of the right vertebral artery to the skullbase.  Mild proximal left subclavian artery calcified plaque with no stenosis. Dominant left vertebral artery within normal origin. Minimal plaque in the left V3 segment, otherwise negative left vertebral artery to the skullbase.  CTA HEAD  Anterior circulation: Both ICA siphons are patent. No plaque or stenosis identified. Ophthalmic artery origins are within normal limits. Carotid termini are patent.  Normal MCA and right ACA origins. Diminutive non dominant left ACA A1 segment. Anterior communicating artery within normal limits. A small median artery of the corpus callosum is present, and otherwise the proximal ACA is have an  azygos type appearance. Bilateral ACA branches are within normal limits.  Left MCA branches are within normal limits. Right MCA branches are within normal limits.  Posterior circulation: Dominant distal left vertebral artery. No distal vertebral artery irregularity or stenosis. Both PICA origins are patent (series 5, image 248. Proximal PICA vessels appear within normal limits.  Patent vertebrobasilar junction. No basilar artery stenosis. SCA and PCA origins are normal. Bilateral PCA branches are within normal limits. Posterior communicating arteries are diminutive or absent.  Venous sinuses: Within normal limits.  Anatomic variants: Mildly non dominant right vertebral artery. Azygos type proximal ACA anatomy.  IMPRESSION: Mild atherosclerosis with no extra- or intracranial arterial stenosis or occlusion identified. Non dominant right vertebral artery, but normal appearing right PICA.  Study discussed by telephone with Dr. Ritta SlotMcNeill Kirkpatrick at 1140 hr on 09/06/2014.    Electronically Signed   By: Augusto GambleLee  Hall M.D.   On: 09/06/2014 11:50   Mr Brain Wo Contrast  09/06/2014   CLINICAL DATA:  Sudden onset of dizziness, nausea, and vomiting which began earlier today. Symptoms have now resolved. Initial encounter.  EXAM: MRI HEAD WITHOUT CONTRAST  MRA HEAD WITHOUT CONTRAST  TECHNIQUE: Multiplanar, multiecho pulse sequences of the brain and surrounding structures were obtained without intravenous contrast. Angiographic images of the head were obtained using MRA technique without contrast.  COMPARISON:  CT head without contrast and CTA head neck 09/06/2014.  FINDINGS: MRI HEAD FINDINGS  The patient was unable to remain motionless for the exam. Small or subtle lesions could be overlooked.  No evidence for acute infarction, hemorrhage, mass lesion, hydrocephalus, or extra-axial fluid. Normal for age cerebral volume. No white matter disease. Flow voids are maintained throughout the carotid, basilar, and vertebral arteries. There are no areas of chronic hemorrhage. Pituitary, pineal, and cerebellar tonsils unremarkable. No upper cervical lesions. Visualized calvarium, skull base, and upper cervical osseous structures unremarkable. Scalp and extracranial soft tissues, orbits, sinuses, and mastoids show no acute process.  MRA HEAD FINDINGS  The internal carotid arteries are widely patent. The basilar artery is widely patent with both vertebrals contributing, LEFT dominant. There is no intracranial stenosis or aneurysm. Variant dual superior cerebellar arteries on the RIGHT.  IMPRESSION: Motion degraded exam demonstrating no evidence for brainstem stroke or other acute pathology.  Unremarkable appearing MRA of the intracranial circulation.   Electronically Signed   By: Davonna BellingJohn  Curnes M.D.   On: 09/06/2014 17:00   Mr Maxine GlennMra Head/brain Wo Cm  09/06/2014   CLINICAL DATA:  Sudden onset of dizziness, nausea, and vomiting which began earlier today. Symptoms have now resolved. Initial encounter.  EXAM:  MRI HEAD WITHOUT CONTRAST  MRA HEAD WITHOUT CONTRAST  TECHNIQUE: Multiplanar, multiecho pulse sequences of the brain and surrounding structures were obtained without intravenous contrast. Angiographic images of the head were obtained using MRA technique without contrast.  COMPARISON:  CT head without contrast and CTA head neck 09/06/2014.  FINDINGS: MRI HEAD FINDINGS  The patient was unable to remain motionless for the exam. Small or subtle lesions could be overlooked.  No evidence for acute infarction, hemorrhage, mass lesion, hydrocephalus, or extra-axial fluid. Normal for age cerebral volume. No white matter disease. Flow voids are maintained throughout the carotid, basilar, and vertebral arteries. There are no areas of chronic hemorrhage. Pituitary, pineal, and cerebellar tonsils unremarkable. No upper cervical lesions. Visualized calvarium, skull base, and upper cervical osseous structures unremarkable. Scalp and extracranial soft tissues, orbits, sinuses, and mastoids show no acute process.  MRA HEAD FINDINGS  The internal carotid arteries are widely patent. The basilar artery is widely patent with both vertebrals contributing, LEFT dominant. There is no intracranial stenosis or aneurysm. Variant dual superior cerebellar arteries on the RIGHT.  IMPRESSION: Motion degraded exam demonstrating no evidence for brainstem stroke or other acute pathology.  Unremarkable appearing MRA of the intracranial circulation.   Electronically Signed   By: Davonna Belling M.D.   On: 09/06/2014 17:00    Microbiology: No results found for this or any previous visit (from the past 240 hour(s)).   Labs: Basic Metabolic Panel:  Recent Labs Lab 09/06/14 1050 09/06/14 1100  NA 138 141  K 3.2* 3.0*  CL 105 102  CO2 20  --   GLUCOSE 165* 164*  BUN 20 25*  CREATININE 1.19 1.10  CALCIUM 9.7  --    Liver Function Tests:  Recent Labs Lab 09/06/14 1050  AST 36  ALT 29  ALKPHOS 66  BILITOT 0.6  PROT 6.7  ALBUMIN  4.5   No results for input(s): LIPASE, AMYLASE in the last 168 hours. No results for input(s): AMMONIA in the last 168 hours. CBC:  Recent Labs Lab 09/06/14 1050 09/06/14 1100  WBC 11.2*  --   NEUTROABS 5.1  --   HGB 15.3 16.7  HCT 45.4 49.0  MCV 86.8  --   PLT 233  --    Cardiac Enzymes:  Recent Labs Lab 09/06/14 1405 09/06/14 1951 09/07/14 0109  TROPONINI <0.03 <0.03 <0.03   BNP: BNP (last 3 results) No results for input(s): PROBNP in the last 8760 hours. CBG: No results for input(s): GLUCAP in the last 168 hours.     Signed:  Hollice Espy  [facility] 09/07/2014, 10:03 PM

## 2014-09-07 NOTE — Progress Notes (Signed)
D/C orders received. Pt and spouse educated on d/c instructions and stoke education. Pt verbalized understanding. Pt handed d/c packet. IVs and tele removed. Pt taken downstairs by staff.

## 2014-09-07 NOTE — Evaluation (Signed)
Physical Therapy Evaluation Patient Details Name: Timothy Cooley MRN: [id number] DOB: 03/18/1956 Today's Date: 09/07/2014   History of Present Illness  Patient is a 58 y/o male with PMH of HTN and HLD who presents with sudden onset vertigo and unsteady gait that started abruptly at 10 AM 09/06/2014. In ED, pt with right-sided ataxia, facial droop, slurred speech and TPA was offered but pt refused. NIHSS-4. Head CT- Questionable early loss of gray-white matter differentiation in the left occipital lobe such as due to acute left PCA territory in part    Clinical Impression  Patient reports all symptoms have resolved. Pt tolerated ambulating community distances while performing higher level balance challenges and negotiating steps without difficulty. Education provided on signs and symptoms of inner ear dysfunctions as pt with hx of vertigo. Pt does not require skilled therapy services. Discharge from therapy.    Follow Up Recommendations No PT follow up    Equipment Recommendations  None recommended by PT    Recommendations for Other Services       Precautions / Restrictions Precautions Precautions: None Restrictions Weight Bearing Restrictions: No      Mobility  Bed Mobility               General bed mobility comments: received sitting in chair upon PT arrival.   Transfers Overall transfer level: Independent                  Ambulation/Gait Ambulation/Gait assistance: Independent Ambulation Distance (Feet): 500 Feet Assistive device: None Gait Pattern/deviations: Drifts right/left;Step-through pattern   Gait velocity interpretation: at or above normal speed for age/gender General Gait Details: Gait WFL. Slight drifting noted at times with more challenging balance activities. See balance section.  Stairs Stairs: Yes Stairs assistance: Modified independent (Device/Increase time) Stair Management: No rails;Alternating pattern Number of Stairs: 12     Wheelchair Mobility    Modified Rankin (Stroke Patients Only)       Balance Overall balance assessment: Needs assistance Sitting-balance support: No upper extremity supported;Feet supported Sitting balance-Leahy Scale: Normal     Standing balance support: During functional activity Standing balance-Leahy Scale: Good               High level balance activites: Direction changes;Turns;Sudden stops;Head turns High Level Balance Comments: Tolerated above higher level balance challenges with mild drifting noted initially. Stepped over objects and tolerated changes in gait speed without deviation. Standardized Balance Assessment Standardized Balance Assessment : Dynamic Gait Index   Dynamic Gait Index Level Surface: Normal Change in Gait Speed: Normal Gait with Horizontal Head Turns: Mild Impairment Gait with Vertical Head Turns: Normal Gait and Pivot Turn: Normal Step Over Obstacle: Normal Step Around Obstacles: Normal Steps: Normal Total Score: 23       Pertinent Vitals/Pain Pain Assessment: No/denies pain    Home Living Family/patient expects to be discharged to:: Private residence Living Arrangements: Spouse/significant other;Children   Type of Home: House Home Access: Stairs to enter Entrance Stairs-Rails: Right Entrance Stairs-Number of Steps: 12 Home Layout: One level Home Equipment: None      Prior Function Level of Independence: Independent               Hand Dominance        Extremity/Trunk Assessment   Upper Extremity Assessment: Overall WFL for tasks assessed           Lower Extremity Assessment: Overall WFL for tasks assessed         Communication   Communication: No difficulties  Cognition Arousal/Alertness: Awake/alert Behavior During Therapy: WFL for tasks assessed/performed Overall Cognitive Status: Within Functional Limits for tasks assessed                      General Comments General comments (skin  integrity, edema, etc.): (-) [employer] Hall[employer] bilaterally. (-) Head thrust bilaterally. Able to track in all quadrants with no evidence of nystagmus or symptoms. Education provided to be aware of positions and movements that bring on symptoms of vertigo in the future and the option of vestibular OPPT if ever needed.    Exercises        Assessment/Plan    PT Assessment Patent does not need any further PT services  PT Diagnosis     PT Problem List    PT Treatment Interventions     PT Goals (Current goals can be found in the Care Plan section) Acute Rehab PT Goals PT Goal Formulation: All assessment and education complete, DC therapy    Frequency     Barriers to discharge        Co-evaluation               End of Session Equipment Utilized During Treatment: Gait belt Activity Tolerance: Patient tolerated treatment well Patient left: Other (comment) (Walking around room)           Time: [id number] PT Time Calculation (min) (ACUTE ONLY): 19 min   Charges:   PT Evaluation $Initial PT Evaluation Tier I: 1 Procedure PT Treatments $Gait Training: 8-22 mins   PT G CodesAlvie Heidelberg:        Folan, Arlena Marsan A 09/07/2014, 2:50 PM Alvie HeidelbergShauna Folan, PT, DPT [phone]

## 2014-09-07 NOTE — Discharge Instructions (Signed)
Cardiac Diet This diet can help prevent heart disease and stroke. Many factors influence your heart health, including eating and exercise habits. Coronary risk rises a lot with abnormal blood fat (lipid) levels. Cardiac meal planning includes limiting unhealthy fats, increasing healthy fats, and making other small dietary changes. General guidelines are as follows:  Adjust calorie intake to reach and maintain desirable body weight.  Limit total fat intake to less than 30% of total calories. Saturated fat should be less than 7% of calories.  Saturated fats are found in animal products and in some vegetable products. Saturated vegetable fats are found in coconut oil, cocoa butter, palm oil, and palm kernel oil. Read labels carefully to avoid these products as much as possible. Use butter in moderation. Choose tub margarines and oils that have 2 grams of fat or less. Good cooking oils are canola and olive oils.  Practice low-fat cooking techniques. Do not fry food. Instead, broil, bake, boil, steam, grill, roast on a rack, stir-fry, or microwave it. Other fat reducing suggestions include:  Remove the skin from poultry.  Remove all visible fat from meats.  Skim the fat off stews, soups, and gravies before serving them.  Steam vegetables in water or broth instead of sauting them in fat.  Avoid foods with trans fat (or hydrogenated oils), such as commercially fried foods and commercially baked goods. Commercial shortening and deep-frying fats will contain trans fat.  Increase intake of fruits, vegetables, whole grains, and legumes to replace foods high in fat.  Increase consumption of nuts, legumes, and seeds to at least 4 servings weekly. One serving of a legume equals  cup, and 1 serving of nuts or seeds equals  cup.  Choose whole grains more often. Have 3 servings per day (a serving is 1 ounce [oz]).  Eat 4 to 5 servings of vegetables per day. A serving of vegetables is 1 cup of raw leafy  vegetables;  cup of raw or cooked cut-up vegetables;  cup of vegetable juice.  Eat 4 to 5 servings of fruit per day. A serving of fruit is 1 medium whole fruit;  cup of dried fruit;  cup of fresh, frozen, or canned fruit;  cup of 100% fruit juice.  Increase your intake of dietary fiber to 20 to 30 grams per day. Insoluble fiber may help lower your risk of heart disease and may help curb your appetite.  Soluble fiber binds cholesterol to be removed from the blood. Foods high in soluble fiber are dried beans, citrus fruits, oats, apples, bananas, broccoli, Brussels sprouts, and eggplant.  Try to include foods fortified with plant sterols or stanols, such as yogurt, breads, juices, or margarines. Choose several fortified foods to achieve a daily intake of 2 to 3 grams of plant sterols or stanols.  Foods with omega-3 fats can help reduce your risk of heart disease. Aim to have a 3.5 oz portion of fatty fish twice per week, such as salmon, mackerel, albacore tuna, sardines, lake trout, or herring. If you wish to take a fish oil supplement, choose one that contains 1 gram of both DHA and EPA.  Limit processed meats to 2 servings (3 oz portion) weekly.  Limit the sodium in your diet to 1500 milligrams (mg) per day. If you have high blood pressure, talk to a registered dietitian about a DASH (Dietary Approaches to Stop Hypertension) eating plan.  Limit sweets and beverages with added sugar, such as soda, to no more than 5 servings per week. One  serving is:   1 tablespoon sugar.  1 tablespoon jelly or jam.   cup sorbet.  1 cup lemonade.   cup regular soda. CHOOSING FOODS Starches  Allowed: Breads: All kinds (wheat, rye, raisin, white, oatmeal, [place], [place], and English muffin bread). Low-fat rolls: English muffins, frankfurter and hamburger buns, bagels, pita bread, tortillas (not fried). Pancakes, waffles, biscuits, and muffins made with recommended oil.  Avoid: Products made with  saturated or trans fats, oils, or whole milk products. Butter rolls, cheese breads, croissants. Commercial doughnuts, muffins, sweet rolls, biscuits, waffles, pancakes, store-bought mixes. Crackers  Allowed: Low-fat crackers and snacks: Animal, graham, rye, saltine (with recommended oil, no lard), oyster, and matzo crackers. Bread sticks, melba toast, rusks, flatbread, pretzels, and light popcorn.  Avoid: High-fat crackers: cheese crackers, butter crackers, and those made with coconut, palm oil, or trans fat (hydrogenated oils). Buttered popcorn. Cereals  Allowed: Hot or cold whole-grain cereals.  Avoid: Cereals containing coconut, hydrogenated vegetable fat, or animal fat. Potatoes / Pasta / Rice  Allowed: All kinds of potatoes, rice, and pasta (such as macaroni, spaghetti, and noodles).  Avoid: Pasta or rice prepared with cream sauce or high-fat cheese. Chow mein noodles, [place] fries. Vegetables  Allowed: All vegetables and vegetable juices.  Avoid: Fried vegetables. Vegetables in cream, butter, or high-fat cheese sauces. Limit coconut. Fruit in cream or custard. Protein  Allowed: Limit your intake of meat, seafood, and poultry to no more than 6 oz (cooked weight) per day. All lean, well-trimmed beef, veal, pork, and lamb. All chicken and [place] without skin. All fish and shellfish. Wild game: wild duck, rabbit, pheasant, and venison. Egg whites or low-cholesterol egg substitutes may be used as desired. Meatless dishes: recipes with dried beans, peas, lentils, and tofu (soybean curd). Seeds and nuts: all seeds and most nuts.  Avoid: Prime grade and other heavily marbled and fatty meats, such as short ribs, spare ribs, rib eye roast or steak, frankfurters, sausage, bacon, and high-fat luncheon meats, mutton. Caviar. Commercially fried fish. Domestic duck, goose, venison sausage. Organ meats: liver, gizzard, heart, chitterlings, brains, kidney, sweetbreads. Dairy  Allowed: Low-fat  cheeses: nonfat or low-fat cottage cheese (1% or 2% fat), cheeses made with part skim milk, such as mozzarella, farmers, string, or ricotta. (Cheeses should be labeled no more than 2 to 6 grams fat per oz.). Skim (or 1%) milk: liquid, powdered, or evaporated. Buttermilk made with low-fat milk. Drinks made with skim or low-fat milk or cocoa. Chocolate milk or cocoa made with skim or low-fat (1%) milk. Nonfat or low-fat yogurt.  Avoid: Whole milk cheeses, including colby, cheddar, [place], Monterey Jack, [place], [place], [place], American, Swiss, and blue. Creamed cottage cheese, cream cheese. Whole milk and whole milk products, including buttermilk or yogurt made from whole milk, drinks made from whole milk. Condensed milk, evaporated whole milk, and 2% milk. Soups and Combination Foods  Allowed: Low-fat low-sodium soups: broth, dehydrated soups, homemade broth, soups with the fat removed, homemade cream soups made with skim or low-fat milk. Low-fat spaghetti, lasagna, chili, and Spanish rice if low-fat ingredients and low-fat cooking techniques are used.  Avoid: Cream soups made with whole milk, cream, or high-fat cheese. All other soups. Desserts and Sweets  Allowed: Sherbet, fruit ices, gelatins, meringues, and angel food cake. Homemade desserts with recommended fats, oils, and milk products. Jam, jelly, honey, marmalade, sugars, and syrups. Pure sugar candy, such as gum drops, hard candy, jelly beans, marshmallows, mints, and small amounts of dark chocolate.  Avoid: Commercially prepared  cakes, pies, cookies, frosting, pudding, or mixes for these products. Desserts containing whole milk products, chocolate, coconut, lard, palm oil, or palm kernel oil. Ice cream or ice cream drinks. Candy that contains chocolate, coconut, butter, hydrogenated fat, or unknown ingredients. Buttered syrups. Fats and Oils  Allowed: Vegetable oils: safflower, sunflower, corn, soybean, cottonseed, sesame, canola, olive,  or peanut. Non-hydrogenated margarines. Salad dressing or mayonnaise: homemade or commercial, made with a recommended oil. Low or nonfat salad dressing or mayonnaise.  Limit added fats and oils to 6 to 8 tsp per day (includes fats used in cooking, baking, salads, and spreads on bread). Remember to count the "hidden fats" in foods.  Avoid: Solid fats and shortenings: butter, lard, salt pork, bacon drippings. Gravy containing meat fat, shortening, or suet. Cocoa butter, coconut. Coconut oil, palm oil, palm kernel oil, or hydrogenated oils: these ingredients are often used in bakery products, nondairy creamers, whipped toppings, candy, and commercially fried foods. Read labels carefully. Salad dressings made of unknown oils, sour cream, or cheese, such as blue cheese and Roquefort. Cream, all kinds: half-and-half, light, heavy, or whipping. Sour cream or cream cheese (even if "light" or low-fat). Nondairy cream substitutes: coffee creamers and sour cream substitutes made with palm, palm kernel, hydrogenated oils, or coconut oil. Beverages  Allowed: Coffee (regular or decaffeinated), tea. Diet carbonated beverages, mineral water. Alcohol: Check with your caregiver. Moderation is recommended.  Avoid: Whole milk, regular sodas, and juice drinks with added sugar. Condiments  Allowed: All seasonings and condiments. Cocoa powder. "Cream" sauces made with recommended ingredients.  Avoid: Carob powder made with hydrogenated fats. SAMPLE MENU Breakfast   cup orange juice   cup oatmeal  1 slice toast  1 tsp margarine  1 cup skim milk Lunch  [place] sandwich with 2 oz [place], 2 slices bread  Lettuce and tomato slices  Fresh fruit  Carrot sticks  Coffee or tea Snack  Fresh fruit or low-fat crackers Dinner  3 oz lean ground beef  1 baked potato  1 tsp margarine   cup asparagus  Lettuce salad  1 tbs non-creamy dressing   cup peach slices  1 cup skim milk Document Released:  06/06/2008 Document Revised: 02/27/2012 Document Reviewed: 10/28/2013 [employer] Patient Information 2015 [place] [place]. This information is not intended to replace advice given to you by your health care provider. Make sure you discuss any questions you have with your health care provider. Heart Failure Heart failure is a condition in which the heart has trouble pumping blood. This means your heart does not pump blood efficiently for your body to work well. In some cases of heart failure, fluid may back up into your lungs or you may have swelling (edema) in your lower legs. Heart failure is usually a long-term (chronic) condition. It is important for you to take good care of yourself and follow your health care provider's treatment plan. CAUSES  Some health conditions can cause heart failure. Those health conditions include:  High blood pressure (hypertension). Hypertension causes the heart muscle to work harder than normal. When pressure in the blood vessels is high, the heart needs to pump (contract) with more force in order to circulate blood throughout the body. High blood pressure eventually causes the heart to become stiff and weak.  Coronary artery disease (CAD). CAD is the buildup of cholesterol and fat (plaque) in the arteries of the heart. The blockage in the arteries deprives the heart muscle of oxygen and blood. This can cause chest pain and may  lead to a heart attack. High blood pressure can also contribute to CAD.  Heart attack (myocardial infarction). A heart attack occurs when one or more arteries in the heart become blocked. The loss of oxygen damages the muscle tissue of the heart. When this happens, part of the heart muscle dies. The injured tissue does not contract as well and weakens the heart's ability to pump blood.  Abnormal heart valves. When the heart valves do not open and close properly, it can cause heart failure. This makes the heart muscle pump harder to keep the blood  flowing.  Heart muscle disease (cardiomyopathy or myocarditis). Heart muscle disease is damage to the heart muscle from a variety of causes. These can include drug or alcohol abuse, infections, or unknown reasons. These can increase the risk of heart failure.  Lung disease. Lung disease makes the heart work harder because the lungs do not work properly. This can cause a strain on the heart, leading it to fail.  Diabetes. Diabetes increases the risk of heart failure. High blood sugar contributes to high fat (lipid) levels in the blood. Diabetes can also cause slow damage to tiny blood vessels that carry important nutrients to the heart muscle. When the heart does not get enough oxygen and food, it can cause the heart to become weak and stiff. This leads to a heart that does not contract efficiently.  Other conditions can contribute to heart failure. These include abnormal heart rhythms, thyroid problems, and low blood counts (anemia). Certain unhealthy behaviors can increase the risk of heart failure, including:  Being overweight.  Smoking or chewing tobacco.  Eating foods high in fat and cholesterol.  Abusing illicit drugs or alcohol.  Lacking physical activity. SYMPTOMS  Heart failure symptoms may vary and can be hard to detect. Symptoms may include:  Shortness of breath with activity, such as climbing stairs.  Persistent cough.  Swelling of the feet, ankles, legs, or abdomen.  Unexplained weight gain.  Difficulty breathing when lying flat (orthopnea).  Waking from sleep because of the need to sit up and get more air.  Rapid heartbeat.  Fatigue and loss of energy.  Feeling light-headed, dizzy, or close to fainting.  Loss of appetite.  Nausea.  Increased urination during the night (nocturia). DIAGNOSIS  A diagnosis of heart failure is based on your history, symptoms, physical examination, and diagnostic tests. Diagnostic tests for heart failure may  include:  Echocardiography.  Electrocardiography.  Chest X-ray.  Blood tests.  Exercise stress test.  Cardiac angiography.  Radionuclide scans. TREATMENT  Treatment is aimed at managing the symptoms of heart failure. Medicines, behavioral changes, or surgical intervention may be necessary to treat heart failure.  Medicines to help treat heart failure may include:  Angiotensin-converting enzyme (ACE) inhibitors. This type of medicine blocks the effects of a blood protein called angiotensin-converting enzyme. ACE inhibitors relax (dilate) the blood vessels and help lower blood pressure.  Angiotensin receptor blockers (ARBs). This type of medicine blocks the actions of a blood protein called angiotensin. Angiotensin receptor blockers dilate the blood vessels and help lower blood pressure.  Water pills (diuretics). Diuretics cause the kidneys to remove salt and water from the blood. The extra fluid is removed through urination. This loss of extra fluid lowers the volume of blood the heart pumps.  Beta blockers. These prevent the heart from beating too fast and improve heart muscle strength.  Digitalis. This increases the force of the heartbeat.  Healthy behavior changes include:  Obtaining and maintaining  a healthy weight.  Stopping smoking or chewing tobacco.  Eating heart-healthy foods.  Limiting or avoiding alcohol.  Stopping illicit drug use.  Physical activity as directed by your health care provider.  Surgical treatment for heart failure may include:  A procedure to open blocked arteries, repair damaged heart valves, or remove damaged heart muscle tissue.  A pacemaker to improve heart muscle function and control certain abnormal heart rhythms.  An internal cardioverter defibrillator to treat certain serious abnormal heart rhythms.  A left ventricular assist device (LVAD) to assist the pumping ability of the heart. HOME CARE INSTRUCTIONS   Take medicines only  as directed by your health care provider. Medicines are important in reducing the workload of your heart, slowing the progression of heart failure, and improving your symptoms.  Do not stop taking your medicine unless directed by your health care provider.  Do not skip any dose of medicine.  Refill your prescriptions before you run out of medicine. Your medicines are needed every day.  Engage in moderate physical activity if directed by your health care provider. Moderate physical activity can benefit some people. The elderly and people with severe heart failure should consult with a health care provider for physical activity recommendations.  Eat heart-healthy foods. Food choices should be free of trans fat and low in saturated fat, cholesterol, and salt (sodium). Healthy choices include fresh or frozen fruits and vegetables, fish, lean meats, legumes, fat-free or low-fat dairy products, and whole grain or high fiber foods. Talk to a dietitian to learn more about heart-healthy foods.  Limit sodium if directed by your health care provider. Sodium restriction may reduce symptoms of heart failure in some people. Talk to a dietitian to learn more about heart-healthy seasonings.  Use healthy cooking methods. Healthy cooking methods include roasting, grilling, broiling, baking, poaching, steaming, or stir-frying. Talk to a dietitian to learn more about healthy cooking methods.  Limit fluids if directed by your health care provider. Fluid restriction may reduce symptoms of heart failure in some people.  Weigh yourself every day. Daily weights are important in the early recognition of excess fluid. You should weigh yourself every morning after you urinate and before you eat breakfast. Wear the same amount of clothing each time you weigh yourself. Record your daily weight. Provide your health care provider with your weight record.  Monitor and record your blood pressure if directed by your health care  provider.  Check your pulse if directed by your health care provider.  Lose weight if directed by your health care provider. Weight loss may reduce symptoms of heart failure in some people.  Stop smoking or chewing tobacco. Nicotine makes your heart work harder by causing your blood vessels to constrict. Do not use nicotine gum or patches before talking to your health care provider.  Keep all follow-up visits as directed by your health care provider. This is important.  Limit alcohol intake to no more than 1 drink per day for nonpregnant women and 2 drinks per day for men. One drink equals 12 ounces of beer, 5 ounces of wine, or 1 ounces of hard liquor. Drinking more than that is harmful to your heart. Tell your health care provider if you drink alcohol several times a week. Talk with your health care provider about whether alcohol is safe for you. If your heart has already been damaged by alcohol or you have severe heart failure, drinking alcohol should be stopped completely.  Stop illicit drug use.  Stay up-to-date  with immunizations. It is especially important to prevent respiratory infections through current pneumococcal and influenza immunizations.  Manage other health conditions such as hypertension, diabetes, thyroid disease, or abnormal heart rhythms as directed by your health care provider.  Learn to manage stress.  Plan rest periods when fatigued.  Learn strategies to manage high temperatures. If the weather is extremely hot:  Avoid vigorous physical activity.  Use air conditioning or fans or seek a cooler location.  Avoid caffeine and alcohol.  Wear loose-fitting, lightweight, and light-colored clothing.  Learn strategies to manage cold temperatures. If the weather is extremely cold:  Avoid vigorous physical activity.  Layer clothes.  Wear mittens or gloves, a hat, and a scarf when going outside.  Avoid alcohol.  Obtain ongoing education and support as  needed.  Participate in or seek rehabilitation as needed to maintain or improve independence and quality of life. SEEK MEDICAL CARE IF:   Your weight increases by 03 lb/1.4 kg in 1 day or 05 lb/2.3 kg in a week.  You have increasing shortness of breath that is unusual for you.  You are unable to participate in your usual physical activities.  You tire easily.  You cough more than normal, especially with physical activity.  You have any or more swelling in areas such as your hands, feet, ankles, or abdomen.  You are unable to sleep because it is hard to breathe.  You feel like your heart is beating fast (palpitations).  You become dizzy or light-headed upon standing up. SEEK IMMEDIATE MEDICAL CARE IF:   You have difficulty breathing.  There is a change in mental status such as decreased alertness or difficulty with concentration.  You have a pain or discomfort in your chest.  You have an episode of fainting (syncope). MAKE SURE YOU:   Understand these instructions.  Will watch your condition.  Will get help right away if you are not doing well or get worse. Document Released: 08/28/2005 Document Revised: 01/12/2014 Document Reviewed: 09/27/2012 [facility] Patient Information 2015 [place], [place]. This information is not intended to replace advice given to you by your health care provider. Make sure you discuss any questions you have with your health care provider. Transient Ischemic Attack A transient ischemic attack (TIA) is a "warning stroke" that causes stroke-like symptoms. Unlike a stroke, a TIA does not cause permanent damage to the brain. The symptoms of a TIA can happen very fast and do not last long. It is important to know the symptoms of a TIA and what to do. This can help prevent a major stroke or death. CAUSES   A TIA is caused by a temporary blockage in an artery in the brain or neck (carotid artery). The blockage does not allow the brain to get the blood supply  it needs and can cause different symptoms. The blockage can be caused by either:  A blood clot.  Fatty buildup (plaque) in a neck or brain artery. RISK FACTORS  High blood pressure (hypertension).  High cholesterol.  Diabetes mellitus.  Heart disease.  The build up of plaque in the blood vessels (peripheral artery disease or atherosclerosis).  The build up of plaque in the blood vessels providing blood and oxygen to the brain (carotid artery stenosis).  An abnormal heart rhythm (atrial fibrillation).  Obesity.  Smoking.  Taking oral contraceptives (especially in combination with smoking).  Physical inactivity.  A diet high in fats, salt (sodium), and calories.  Alcohol use.  Use of illegal drugs (especially cocaine  and methamphetamine).  Being male.  Being African American.  Being over the age of 33.  Family history of stroke.  Previous history of blood clots, stroke, TIA, or heart attack.  Sickle cell disease. SYMPTOMS  TIA symptoms are the same as a stroke but are temporary. These symptoms usually develop suddenly, or may be newly present upon awakening from sleep:  Sudden weakness or numbness of the face, arm, or leg, especially on one side of the body.  Sudden trouble walking or difficulty moving arms or legs.  Sudden confusion.  Sudden personality changes.  Trouble speaking (aphasia) or understanding.  Difficulty swallowing.  Sudden trouble seeing in one or both eyes.  Double vision.  Dizziness.  Loss of balance or coordination.  Sudden severe headache with no known cause.  Trouble reading or writing.  Loss of bowel or bladder control.  Loss of consciousness. DIAGNOSIS  Your caregiver may be able to determine the presence or absence of a TIA based on your symptoms, history, and physical exam. Computed tomography (CT scan) of the brain is usually performed to help identify a TIA. Other tests may be done to diagnose a TIA. These tests  may include:  Electrocardiography.  Continuous heart monitoring.  Echocardiography.  Carotid ultrasonography.  Magnetic resonance imaging (MRI).  A scan of the brain circulation.  Blood tests. PREVENTION  The risk of a TIA can be decreased by appropriately treating high blood pressure, high cholesterol, diabetes, heart disease, and obesity and by quitting smoking, limiting alcohol, and staying physically active. TREATMENT  Time is of the essence. Since the symptoms of TIA are the same as a stroke, it is important to seek treatment as soon as possible because you may need a medicine to dissolve the clot (thrombolytic) that cannot be given if too much time has passed. Treatment options vary. Treatment options may include rest, oxygen, intravenous (IV) fluids, and medicines to thin the blood (anticoagulants). Medicines and diet may be used to address diabetes, high blood pressure, and other risk factors. Measures will be taken to prevent short-term and long-term complications, including infection from breathing foreign material into the lungs (aspiration pneumonia), blood clots in the legs, and falls. Treatment options include procedures to either remove plaque in the carotid arteries or dilate carotid arteries that have narrowed due to plaque. Those procedures are:  Carotid endarterectomy.  Carotid angioplasty and stenting. HOME CARE INSTRUCTIONS   Take all medicines prescribed by your caregiver. Follow the directions carefully. Medicines may be used to control risk factors for a stroke. Be sure you understand all your medicine instructions.  You may be told to take aspirin or the anticoagulant warfarin. Warfarin needs to be taken exactly as instructed.  Taking too much or too little warfarin is dangerous. Too much warfarin increases the risk of bleeding. Too little warfarin continues to allow the risk for blood clots. While taking warfarin, you will need to have regular blood tests to  measure your blood clotting time. A PT blood test measures how long it takes for blood to clot. Your PT is used to calculate another value called an INR. Your PT and INR help your caregiver to adjust your dose of warfarin. The dose can change for many reasons. It is critically important that you take warfarin exactly as prescribed.  Many foods, especially foods high in vitamin K can interfere with warfarin and affect the PT and INR. Foods high in vitamin K include spinach, kale, broccoli, cabbage, collard and turnip greens, brussels sprouts,  peas, cauliflower, seaweed, and parsley as well as beef and pork liver, green tea, and soybean oil. You should eat a consistent amount of foods high in vitamin K. Avoid major changes in your diet, or notify your caregiver before changing your diet. Arrange a visit with a dietitian to answer your questions.  Many medicines can interfere with warfarin and affect the PT and INR. You must tell your caregiver about any and all medicines you take, this includes all vitamins and supplements. Be especially cautious with aspirin and anti-inflammatory medicines. Do not take or discontinue any prescribed or over-the-counter medicine except on the advice of your caregiver or pharmacist.  Warfarin can have side effects, such as excessive bruising or bleeding. You will need to hold pressure over cuts for longer than usual. Your caregiver or pharmacist will discuss other potential side effects.  Avoid sports or activities that may cause injury or bleeding.  Be mindful when shaving, flossing your teeth, or handling sharp objects.  Alcohol can change the body's ability to handle warfarin. It is best to avoid alcoholic drinks or consume only very small amounts while taking warfarin. Notify your caregiver if you change your alcohol intake.  Notify your dentist or other caregivers before procedures.  Eat a diet that includes 5 or more servings of fruits and vegetables each day. This  may reduce the risk of stroke. Certain diets may be prescribed to address high blood pressure, high cholesterol, diabetes, or obesity.  A low-sodium, low-saturated fat, low-trans fat, low-cholesterol diet is recommended to manage high blood pressure.  A low-saturated fat, low-trans fat, low-cholesterol, and high-fiber diet may control cholesterol levels.  A controlled-carbohydrate, controlled-sugar diet is recommended to manage diabetes.  A reduced-calorie, low-sodium, low-saturated fat, low-trans fat, low-cholesterol diet is recommended to manage obesity.  Maintain a healthy weight.  Stay physically active. It is recommended that you get at least 30 minutes of activity on most or all days.  Do not smoke.  Limit alcohol use even if you are not taking warfarin. Moderate alcohol use is considered to be:  No more than 2 drinks each day for men.  No more than 1 drink each day for nonpregnant women.  Stop drug abuse.  Home safety. A safe home environment is important to reduce the risk of falls. Your caregiver may arrange for specialists to evaluate your home. Having grab bars in the bedroom and bathroom is often important. Your caregiver may arrange for equipment to be used at home, such as raised toilets and a seat for the shower.  Follow all instructions for follow-up with your caregiver. This is very important. This includes any referrals and lab tests. Proper follow up can prevent a stroke or another TIA from occurring. SEEK MEDICAL CARE IF:  You have personality changes.  You have difficulty swallowing.  You are seeing double.  You have dizziness.  You have a fever.  You have skin breakdown. SEEK IMMEDIATE MEDICAL CARE IF:  Any of these symptoms may represent a serious problem that is an emergency. Do not wait to see if the symptoms will go away. Get medical help right away. Call your local emergency services (911 in [place] Do not drive yourself to the hospital.  You have  sudden weakness or numbness of the face, arm, or leg, especially on one side of the body.  You have sudden trouble walking or difficulty moving arms or legs.  You have sudden confusion.  You have trouble speaking (aphasia) or understanding.  You have  sudden trouble seeing in one or both eyes.  You have a loss of balance or coordination.  You have a sudden, severe headache with no known cause.  You have new chest pain or an irregular heartbeat.  You have a partial or total loss of consciousness. MAKE SURE YOU:   Understand these instructions.  Will watch your condition.  Will get help right away if you are not doing well or get worse. Document Released: 06/07/2005 Document Revised: 09/02/2013 Document Reviewed: 12/03/2013 [facility] Patient Information 2015 [employer]. This information is not intended to replace advice given to you by your health care provider. Make sure you discuss any questions you have with your health care provider.

## 2014-09-07 NOTE — Progress Notes (Signed)
STROKE TEAM PROGRESS NOTE   HISTORY Timothy MaroonClaude Cooley is a 58 y.o. male with a history of hypertension, hyperlipidemia who presents with sudden onset vertigo and unsteady gait that started abruptly at 10 AM 09/06/2014. On evaluation in the emergency department, he was found to have some right-sided ataxia, facial droop, slurred speech and TPA was offered. The patient initially wanted to go ahead with that, but after speaking with his wife and daughters decided against proceeding. He also had some numbness of his right side earlier which has improved. NIHSS: 4. Patient was not administered TPA secondary to patient refusal. He was admitted for further evaluation and treatment.   SUBJECTIVE (INTERVAL HISTORY) His wife and family are at the bedside.  Overall he feels his condition is completely resolved. Patient reports on Dec 5 he was walking out of the den, turns his head to the left (or right, not sure), realized he was dizzy, stumbled, after 5 mins then was ok. described the room as spinning. Next time on Dec 23, turned his head to the left (or right, not sure) and became dizzy again. went to bedroom, sat down. Lasted longer than the first time, about 10 mins. Yesterday, Dec 27 went to the gym and worked out. Had vertigo while taking a shower, unsure if he turned his head or not. Sat down. Then dressed. Drove home however vertigo getting worse with N/V, pulled over and called wife. Then wife brought him to the hospital. The dizziness this time lasted for hours.    OBJECTIVE Temp:  [97.9 F (36.6 C)-98.2 F (36.8 C)] 97.9 F (36.6 C) (12/28 1343) Pulse Rate:  [66-86] 86 (12/28 1343) Cardiac Rhythm:  [-] Normal sinus rhythm (12/28 0805) Resp:  [16-20] 20 (12/28 1343) BP: (121-139)/(72-76) 131/72 mmHg (12/28 1343) SpO2:  [96 %-98 %] 98 % (12/28 1343)  No results for input(s): GLUCAP in the last 168 hours.  Recent Labs Lab 09/06/14 1050 09/06/14 1100  NA 138 141  K 3.2* 3.0*  CL 105 102  CO2 20   --   GLUCOSE 165* 164*  BUN 20 25*  CREATININE 1.19 1.10  CALCIUM 9.7  --     Recent Labs Lab 09/06/14 1050  AST 36  ALT 29  ALKPHOS 66  BILITOT 0.6  PROT 6.7  ALBUMIN 4.5    Recent Labs Lab 09/06/14 1050 09/06/14 1100  WBC 11.2*  --   NEUTROABS 5.1  --   HGB 15.3 16.7  HCT 45.4 49.0  MCV 86.8  --   PLT 233  --     Recent Labs Lab 09/06/14 1405 09/06/14 1951 09/07/14 0109  TROPONINI <0.03 <0.03 <0.03    Recent Labs  09/06/14 1050  LABPROT 12.7  INR 0.94    Recent Labs  09/06/14 1759  COLORURINE YELLOW  LABSPEC >1.046*  PHURINE 7.0  GLUCOSEU NEGATIVE  HGBUR NEGATIVE  BILIRUBINUR NEGATIVE  KETONESUR NEGATIVE  PROTEINUR NEGATIVE  UROBILINOGEN 0.2  NITRITE NEGATIVE  LEUKOCYTESUR NEGATIVE       Component Value Date/Time   CHOL 179 09/07/2014 0109   TRIG 96 09/07/2014 0109   HDL 49 09/07/2014 0109   CHOLHDL 3.7 09/07/2014 0109   VLDL 19 09/07/2014 0109   LDLCALC 111* 09/07/2014 0109   Lab Results  Component Value Date   HGBA1C 6.3* 09/07/2014      Component Value Date/Time   LABOPIA NONE DETECTED 09/06/2014 1746   COCAINSCRNUR NONE DETECTED 09/06/2014 1746   LABBENZ NONE DETECTED 09/06/2014 1746   AMPHETMU  NONE DETECTED 09/06/2014 1746   THCU NONE DETECTED 09/06/2014 1746   LABBARB NONE DETECTED 09/06/2014 1746     Recent Labs Lab 09/06/14 1050  ETH <5   I have personally reviewed the radiological images below and agree with the radiology interpretations.  Dg Chest 2 View 09/06/2014    Low volume exam with mild cardiac enlargement. No acute chest finding.     Ct Head Wo Contrast 09/06/2014   1. Questionable early loss of gray-white matter differentiation in the left occipital lobe such as due to acute left PCA territory in part. 2. Elsewhere normal non contrast CT appearance of the brain.    Ct Angio Head & Neck W/cm &/or Wo/cm 09/06/2014    Mild atherosclerosis with no extra- or intracranial arterial stenosis or occlusion  identified. Non dominant right vertebral artery, but normal appearing right PICA.    Mr Brain Wo Contrast 09/06/2014   Motion degraded exam demonstrating no evidence for brainstem stroke or other acute pathology.    Mr Maxine Glenn Head/brain Wo Cm 09/06/2014    Unremarkable appearing MRA of the intracranial circulation.     2D echo - - Left ventricle: The cavity size was normal. Wall thickness was normal. Systolic function was normal. The estimated ejection fraction was in the range of 60% to 65%. Wall motion was normal; there were no regional wall motion abnormalities. Features are consistent with a pseudonormal left ventricular filling pattern, with concomitant abnormal relaxation and increased filling pressure (grade 2 diastolic dysfunction). - Left atrium: The atrium was mildly dilated.   PHYSICAL EXAM Physical exam  Temp:  [97.9 F (36.6 C)-98.2 F (36.8 C)] 97.9 F (36.6 C) (12/28 1343) Pulse Rate:  [66-86] 86 (12/28 1343) Resp:  [16-20] 20 (12/28 1343) BP: (121-139)/(72-76) 131/72 mmHg (12/28 1343) SpO2:  [96 %-98 %] 98 % (12/28 1343)  General - Well nourished, well developed, in no apparent distress.  Ophthalmologic - Sharp disc margins OU.  Cardiovascular - Regular rate and rhythm with no murmur.  Mental Status -  Level of arousal and orientation to time, place, and person were intact. Language including expression, naming, repetition, comprehension, reading, and writing was assessed and found intact. Attention span and concentration were normal. Recent and remote memory were intact. Fund of Knowledge was assessed and was intact.  Cranial Nerves II - XII - II - Visual field intact OU. III, IV, VI - Extraocular movements intact. V - Facial sensation intact bilaterally. VII - Facial movement intact bilaterally. VIII - Hearing & vestibular intact bilaterally. X - Palate elevates symmetrically. XI - Chin turning & shoulder shrug intact bilaterally. XII -  Tongue protrusion intact.  Motor Strength - The patient's strength was normal in all extremities and pronator drift was absent.  Bulk was normal and fasciculations were absent.   Motor Tone - Muscle tone was assessed at the neck and appendages and was normal.  Reflexes - The patient's reflexes were normal in all extremities and he had no pathological reflexes.  Sensory - Light touch, temperature/pinprick, vibration and proprioception, and Romberg testing were assessed and were normal.    Coordination - The patient had normal movements in the hands and feet with no ataxia or dysmetria.  Tremor was absent.  Gait and Station - The patient's transfers, posture, gait, station, and turns were observed as normal.   ASSESSMENT/PLAN [name] is a 58 y.o. right-handed male with history of hypertension, hyperlipidemia presenting with sudden onset vertigo x 3 times over the last month,  lasting mins to hours, unsteady gait and vomiting. Denies hearing loss, vision changes, headache. Never happened before. No ear ringing.  He did not receive IV t-PA as patient refused.   TIA vs BPPV vs Meniere's vs. Vestibular proxysmia. Not convinced this is stroke based on exam. Does not consistent with Bowhunter syndrome as no significant VA stenosis. Not consistent with vestibular neuritis as pt has episodic vertigo.   Resultant  Vertigo has resolved  MRI  No acute stroke seen  MRA  No large significant stenosis   CT angio head and neck Unremarkable. R VA smaller than L VA   2D Echo  unremarkable   HgbA1c 6.3, at the goal  Lovenox 40 mg sq daily for VTE prophylaxis  Diet - low sodium heart healthy thin liquids  no antithrombotic prior to admission, now on aspirin 325 mg orally every day Will change to plavix for better coverage (order written)   Ongoing aggressive stroke risk factor management  Therapy recommendations:  No anticipated therapy needs  Disposition:  Return home  Watch for stroke  signs/symptoms.   Gradually increase work load.  Ok for discharge from stroke standpoint once workup is completed  Follow up DR. Ralph Brouwer in 2 months (order written)  Hypertension  Home meds:   HCTZ  BP 121-170/64-148 past 24h (09/07/2014 @ 11:22 PM)  Stable  Hyperlipidemia  Home meds:  lipitor 10 daily x 1 year, resumed in hospital  LDL 111, goal < 70  Agree with reported plans to increase statin  Continue statin at discharge  Other Active Problems  Hypokalemia, replaced  Other Pertinent History  [place]  URI with severe cough post travel, treated w/ abx  Insomnia, take aleve PM nightly  Hospital day # 1  Rhoderick MoodyBIBY,SHARON  [facility] See Amion for Pager information 09/07/2014 12:44 PM   I, the attending vascular neurologist, have personally obtained a history, examined the patient, evaluated laboratory data, individually viewed imaging studies and agree with radiology interpretations. I also obtained additional history from pt's wife and daughter at bedside. Together with the NP/PA, we formulated the assessment and plan of care which reflects our mutual decision.  I have made any additions or clarifications directly to the above note and agree with the findings and plan as currently documented.   58 yo M with PMH of HTN and HLD admitted for 3 episodes of vertigo, lasting minutes or hours. All occurred while standing. With N/V if severe vertigo. Denies smoking, illicit drug, ear ringing, hearing loss, vision changes, speech difficulty. MRI negative. DDx include TIA vs. meniere's vs vestibular neuritis vs. Vestibular paroxysmia. Will treat for worse scenario which is TIA. Switch to plavix and continue statin. Follow up in clinic. Risk factor control.   Marvel PlanJindong Allina Riches, MD PhD Stroke Neurology 09/07/2014 11:28 PM       To contact Stroke Continuity provider, please refer to [email or web address]. After hours, contact General Neurology

## 2014-09-08 NOTE — Progress Notes (Signed)
RETRO / UR COMPLETED 

## 2014-10-01 ENCOUNTER — Ambulatory Visit (INDEPENDENT_AMBULATORY_CARE_PROVIDER_SITE_OTHER): Payer: 59 | Admitting: Cardiovascular Disease

## 2014-10-01 ENCOUNTER — Encounter: Payer: Self-pay | Admitting: Cardiovascular Disease

## 2014-10-01 VITALS — BP 110/78 | HR 54 | Ht 73.0 in | Wt 222.8 lb

## 2014-10-01 DIAGNOSIS — I5032 Chronic diastolic (congestive) heart failure: Secondary | ICD-10-CM

## 2014-10-01 DIAGNOSIS — E785 Hyperlipidemia, unspecified: Secondary | ICD-10-CM

## 2014-10-01 DIAGNOSIS — I10 Essential (primary) hypertension: Secondary | ICD-10-CM

## 2014-10-01 MED ORDER — ASPIRIN EC 81 MG PO TBEC: 81.0000 mg | DELAYED_RELEASE_TABLET | Freq: Every day | ORAL | Status: DC

## 2014-10-01 NOTE — Patient Instructions (Signed)
Your physician has recommended you make the following change in your medication:  DECREASE Aspirin to 81 mg once daily  Your physician recommends that you schedule a follow-up appointment in: as needed with Dr. Elease HashimotoNahser

## 2014-10-01 NOTE — Progress Notes (Signed)
Cardiology Office Note   Date:  10/01/2014   ID:  Timothy Cooley, DOB 03-22-1956, MRN [id number]  PCP:  Timothy Cooley, Timothy JAY, MD  Cardiologist:   Timothy Cooley, Timothy Maron J, MD   Chief Complaint  Patient presents with  . OTHER    New Patient, post hosp      History of Present Illness: Timothy Cooley is a 59 y.o. male who presents for follow up of a recent hospitalization for   Vertigo - was thought to have had a TIA but the MRI was negative for any defect.  Chronic diastolic heart failure: HTN -  Hyperlipidemia:   Timothy Cooley was hospitalized in Dec. For shortness of breath and vertigo.  He was thought to have had a TIA.  He admits that he was running pretty hard ( traveling and drinking regularly)  Plays lacrosse regulalry.  Walks playing golf regularly.   No CP , no dypsnea, no vertigo.     Past Medical History  Diagnosis Date  . Hypertension   . Hyperlipidemia     Past Surgical History  Procedure Laterality Date  . None       Current Outpatient Prescriptions  Medication Sig Dispense Refill  . aspirin EC 325 MG tablet Take 1 tablet (325 mg total) by mouth daily. 30 tablet 0  . atorvastatin (LIPITOR) 40 MG tablet Take 1 tablet (40 mg total) by mouth daily. 30 tablet 1  . carvedilol (COREG) 3.125 MG tablet Take 1 tablet (3.125 mg total) by mouth 2 (two) times daily with a meal. 60 tablet 1  . lisinopril (PRINIVIL,ZESTRIL) 5 MG tablet Take 1 tablet (5 mg total) by mouth daily. 30 tablet 1   No current facility-administered medications for this visit.    Allergies:   Review of patient's allergies indicates no known allergies.    Social History:  The patient  reports that he has never smoked. He does not have any smokeless tobacco history on file. He reports that he does not use illicit drugs.   Family History:  The patient's family history includes Heart Problems in his mother.    ROS:  Please see the history of present illness.   Otherwise, review of systems are positive  for none.   All other systems are reviewed and negative.    PHYSICAL EXAM: VS:  BP 110/78 mmHg  Pulse 54  Ht 6\' 1"  (1.854 m)  Wt 222 lb 12.8 oz (101.061 kg)  BMI 29.40 kg/m2  SpO2 99% , BMI Body mass index is 29.4 kg/(m^2). GEN: Well nourished, well developed, in no acute distress HEENT: normal Neck: no JVD, carotid bruits, or masses Cardiac: RRR; no murmurs, rubs, or gallops,no edema  Respiratory:  clear to auscultation bilaterally, normal work of breathing GI: soft, nontender, nondistended, + BS MS: no deformity or atrophy Skin: warm and dry, no rash Neuro:  Strength and sensation are intact Psych: euthymic mood, full affect   EKG:  EKG is not ordered today.    Recent Labs: 09/06/2014: ALT 29; BUN 25*; Creatinine 1.10; Hemoglobin 16.7; Platelets 233; Potassium 3.0*; Sodium 141 09/07/2014: B Natriuretic Peptide 35.0    Lipid Panel    Component Value Date/Time   CHOL 179 09/07/2014 0109   TRIG 96 09/07/2014 0109   HDL 49 09/07/2014 0109   CHOLHDL 3.7 09/07/2014 0109   VLDL 19 09/07/2014 0109   LDLCALC 111* 09/07/2014 0109      Wt Readings from Last 3 Encounters:  10/01/14 222 lb 12.8 oz (101.061 kg)  09/06/14 225 lb (102.059 kg)      Other studies Reviewed: Additional studies/ records that were reviewed today include: Hospital records. Review of the above records demonstrates: no evidence of Afib, no TIA or CVA    ASSESSMENT AND PLAN:  1.  ?   TIA (transient ischemic attack): plans per int. Medicine ,  His MRI was negative.  He thinks it was just veritgo , reduce ASA to 81    2.  Chronic diastolic heart failure: Echocardiogram noted grade 2 diastolic dysfunction. BNP checked and found to be normal in the hospital   .was sent home from the hospital  on carvedilol 3.125 mg twice a day and lisinopril 5 mg a day.  Continue current meds.    3.  HTN (hypertension): Well-controlled on current medications.  4.   Hyperlipidemia: LDL goal less than 70, Lipitor  increased to 40 mg daily      Current medicines are reviewed at length with the patient today.  The patient does not have concerns regarding medicines.  The following changes have been made:  no change  Labs/ tests ordered today include:  No orders of the defined types were placed in this encounter.     Disposition:   FU with me  As needed.    Signed, Timothy Cooley, Timothy Ping, MD  10/01/2014 2:07 PM    [facility] [address] [place], [place], [place]  [address] Phone: [phone]; Fax: [phone]

## 2014-11-02 ENCOUNTER — Ambulatory Visit: Payer: Self-pay | Admitting: Neurology

## 2018-06-26 ENCOUNTER — Other Ambulatory Visit: Payer: Self-pay | Admitting: Gastroenterology

## 2018-06-26 DIAGNOSIS — R109 Unspecified abdominal pain: Secondary | ICD-10-CM

## 2018-07-02 ENCOUNTER — Ambulatory Visit
Admission: RE | Admit: 2018-07-02 | Discharge: 2018-07-02 | Disposition: A | Discharge status: Home / Self Care | Payer: 59 | Source: Ambulatory Visit | Attending: Gastroenterology | Admitting: Gastroenterology

## 2018-07-02 DIAGNOSIS — R109 Unspecified abdominal pain: Secondary | ICD-10-CM

## 2018-07-02 MED ORDER — IOPAMIDOL (ISOVUE-300) INJECTION 61%
125.0000 mL | Freq: Once | INTRAVENOUS | Status: AC | PRN
  Administered 2018-07-02: 125 mL via INTRAVENOUS

## 2018-09-23 ENCOUNTER — Other Ambulatory Visit: Payer: Self-pay | Admitting: Neurological Surgery

## 2018-09-23 ENCOUNTER — Other Ambulatory Visit (HOSPITAL_COMMUNITY): Payer: Self-pay | Admitting: Neurological Surgery

## 2018-09-23 ENCOUNTER — Ambulatory Visit (HOSPITAL_COMMUNITY): Payer: 59

## 2018-09-23 ENCOUNTER — Ambulatory Visit (HOSPITAL_COMMUNITY)
Admission: RE | Admit: 2018-09-23 | Discharge: 2018-09-23 | Disposition: A | Discharge status: Home / Self Care | Payer: 59 | Source: Ambulatory Visit | Attending: Neurological Surgery | Admitting: Neurological Surgery

## 2018-09-23 DIAGNOSIS — G959 Disease of spinal cord, unspecified: Secondary | ICD-10-CM | POA: Diagnosis not present

## 2018-09-27 ENCOUNTER — Other Ambulatory Visit: Payer: Self-pay | Admitting: Neurological Surgery

## 2018-10-03 ENCOUNTER — Encounter (HOSPITAL_COMMUNITY): Payer: Self-pay

## 2018-10-03 NOTE — Pre-Procedure Instructions (Signed)
Timothy Cooley  10/03/2018      [id number] - [name], [name] [name] [address] Phone: [phone] Fax: [phone]    Your procedure is scheduled on Tuesday, Jan. 28th   Report to [facility] Admitting at  9:00 AM             (posted surgery time 11a - 2:52p)   Call this number if you have problems the morning of surgery:  [phone]   Remember:   Do not eat any foods or drink any liquids after midnight, Monday.   5 days prior to surgery, STOP TAKING any Vitamins, Herbal Supplements, Anti-inflammatories, Blood Thinners.   Take these medicines the morning of surgery with A SIP OF WATER : NONE    Do not wear jewelry - no rings or watches.  Do not wear lotions, colognes or deodorant.   Men may shave face and neck.  Do not bring valuables to the hospital.  [facility] is not responsible for any belongings or valuables.  Contacts, dentures or bridgework may not be worn into surgery.  Leave your suitcase in the car.  After surgery it may be brought to your room.  For patients admitted to the hospital, discharge time will be determined by your treatment team.  Please read over the following fact sheets that you were given. Pain Booklet, Coughing and Deep Breathing, MRSA Information and Surgical Site Infection Prevention      [facility]- Preparing For Surgery  Before surgery, you can play an important role. Because skin is not sterile, your skin needs to be as free of germs as possible. You can reduce the number of germs on your skin by washing with CHG (chlorahexidine gluconate) Soap before surgery.  CHG is an antiseptic cleaner which kills germs and bonds with the skin to continue killing germs even after washing.    Oral Hygiene is also important to reduce your risk of infection.    Remember - BRUSH YOUR TEETH THE MORNING OF SURGERY WITH YOUR REGULAR  TOOTHPASTE  Please do not use if you have an allergy to CHG or antibacterial soaps. If your skin becomes reddened/irritated stop using the CHG.  Do not shave (including legs and underarms) for at least 48 hours prior to first CHG shower. It is OK to shave your face.  Please follow these instructions carefully.   1. Shower the NIGHT BEFORE SURGERY and the MORNING OF SURGERY with CHG.   2. If you chose to wash your hair, wash your hair first as usual with your normal shampoo.  3. After you shampoo, rinse your hair and body thoroughly to remove the shampoo.  4. Use CHG as you would any other liquid soap. You can apply CHG directly to the skin and wash gently with a scrungie or a clean washcloth.   5. Apply the CHG Soap to your body ONLY FROM THE NECK DOWN.  Do not use on open wounds or open sores. Avoid contact with your eyes, ears, mouth and genitals (private parts). Wash Face and genitals (private parts)  with your normal soap.  6. Wash thoroughly, paying special attention to the area where your surgery will be performed.  7. Thoroughly rinse your body with warm water from the neck down.  8. DO NOT shower/wash with your normal soap after using and rinsing off the CHG Soap.  9. Pat yourself dry with a  CLEAN TOWEL.  10. Wear CLEAN PAJAMAS to bed the night before surgery, wear comfortable clothes the morning of surgery  11. Place CLEAN SHEETS on your bed the night of your first shower and DO NOT SLEEP WITH PETS.   Day of Surgery:  Do not apply any deodorants/lotions.  Please wear clean clothes to the hospital/surgery center.   Remember to brush your teeth WITH YOUR REGULAR TOOTHPASTE.   PLEASE RE=READ THIS MATERIAL BEFORE SURGERY

## 2018-10-03 NOTE — Pre-Procedure Instructions (Signed)
Timothy Cooley  10/03/2018      [id number] - [employer], [place] - [address] AT [facility] [address] [place] [address] Phone: [phone] Fax: [phone]    Your procedure is scheduled on Tuesday, January 28th.  Report to [facility] Admitting at 9:00 A.M.  Call this number if you have problems the morning of surgery:  [phone]   Remember:  Do not eat or drink after midnight.    Take these medicines the morning of surgery with A SIP OF WATER: NONE  As of today, STOP taking any Aspirin (unless otherwise instructed by your surgeon), Aleve, Naproxen, Ibuprofen, Motrin, Advil, Goody's, BC's, all herbal medications, fish oil, and all vitamins.     Do not wear jewelry.  Do not wear lotions, powders, or colognes, or deodorant.  Men may shave face and neck.  Do not bring valuables to the hospital.  [facility] is not responsible for any belongings or valuables.  Contacts, dentures or bridgework may not be worn into surgery.  Leave your suitcase in the car.  After surgery it may be brought to your room.  For patients admitted to the hospital, discharge time will be determined by your treatment team.  Patients discharged the day of surgery will not be allowed to drive home.   Special instructions:   [place]- Preparing For Surgery  Before surgery, you can play an important role. Because skin is not sterile, your skin needs to be as free of germs as possible. You can reduce the number of germs on your skin by washing with CHG (chlorahexidine gluconate) Soap before surgery.  CHG is an antiseptic cleaner which kills germs and bonds with the skin to continue killing germs even after washing.    Oral Hygiene is also important to reduce your risk of infection.  Remember - BRUSH YOUR TEETH THE MORNING OF SURGERY WITH YOUR REGULAR TOOTHPASTE  Please do not use if you have an allergy to CHG or antibacterial soaps. If  your skin becomes reddened/irritated stop using the CHG.  Do not shave (including legs and underarms) for at least 48 hours prior to first CHG shower. It is OK to shave your face.  Please follow these instructions carefully.   1. Shower the NIGHT BEFORE SURGERY and the MORNING OF SURGERY with CHG.   2. If you chose to wash your hair, wash your hair first as usual with your normal shampoo.  3. After you shampoo, rinse your hair and body thoroughly to remove the shampoo.  4. Use CHG as you would any other liquid soap. You can apply CHG directly to the skin and wash gently with a scrungie or a clean washcloth.   5. Apply the CHG Soap to your body ONLY FROM THE NECK DOWN.  Do not use on open wounds or open sores. Avoid contact with your eyes, ears, mouth and genitals (private parts). Wash Face and genitals (private parts)  with your normal soap.  6. Wash thoroughly, paying special attention to the area where your surgery will be performed.  7. Thoroughly rinse your body with warm water from the neck down.  8. DO NOT shower/wash with your normal soap after using and rinsing off the CHG Soap.  9. Pat yourself dry with a CLEAN TOWEL.  10. Wear CLEAN PAJAMAS to bed the night before surgery, wear comfortable clothes the morning of surgery  11. Place CLEAN SHEETS on your bed the  night of your first shower and DO NOT SLEEP WITH PETS.    Day of Surgery:  Do not apply any deodorants/lotions.  Please wear clean clothes to the hospital/surgery center.   Remember to brush your teeth WITH YOUR REGULAR TOOTHPASTE.   Please read over the following fact sheets that you were given.

## 2018-10-04 ENCOUNTER — Other Ambulatory Visit: Payer: Self-pay

## 2018-10-04 ENCOUNTER — Encounter (HOSPITAL_COMMUNITY): Payer: Self-pay

## 2018-10-04 ENCOUNTER — Encounter (HOSPITAL_COMMUNITY)
Admission: RE | Admit: 2018-10-04 | Discharge: 2018-10-04 | Disposition: A | Discharge status: Home / Self Care | Payer: 59 | Source: Ambulatory Visit | Attending: Neurological Surgery | Admitting: Neurological Surgery

## 2018-10-04 DIAGNOSIS — Z01818 Encounter for other preprocedural examination: Secondary | ICD-10-CM | POA: Diagnosis present

## 2018-10-04 LAB — BASIC METABOLIC PANEL
Anion gap: 7 (ref 5–15)
BUN: 17 mg/dL (ref 8–23)
CHLORIDE: 104 mmol/L (ref 98–111)
CO2: 27 mmol/L (ref 22–32)
Calcium: 9.4 mg/dL (ref 8.9–10.3)
Creatinine, Ser: 0.96 mg/dL (ref 0.61–1.24)
GFR calc Af Amer: >60 mL/min (ref >60)
GFR calc non Af Amer: >60 mL/min (ref >60)
Glucose, Bld: 108 mg/dL — ABNORMAL HIGH (ref 70–99)
Potassium: 4 mmol/L (ref 3.5–5.1)
SODIUM: 138 mmol/L (ref 135–145)

## 2018-10-04 LAB — CBC
HCT: 44.4 % (ref 39.0–52.0)
HEMOGLOBIN: 14 g/dL (ref 13.0–17.0)
MCH: 28.3 pg (ref 26.0–34.0)
MCHC: 31.5 g/dL (ref 30.0–36.0)
MCV: 89.7 fL (ref 80.0–100.0)
Platelets: 195 10*3/uL (ref 150–400)
RBC: 4.95 MIL/uL (ref 4.22–5.81)
RDW: 14 % (ref 11.5–15.5)
WBC: 8.6 10*3/uL (ref 4.0–10.5)
nRBC: 0 % (ref 0.0–0.2)

## 2018-10-04 LAB — ABO/RH: ABO/RH(D): A POS

## 2018-10-04 LAB — TYPE AND SCREEN
ABO/RH(D): A POS
ANTIBODY SCREEN: NEGATIVE

## 2018-10-04 LAB — SURGICAL PCR SCREEN
MRSA, PCR: NEGATIVE
Staphylococcus aureus: NEGATIVE

## 2018-10-04 NOTE — Progress Notes (Addendum)
PCP - Dr. Nila Nephew Cardiologist - Saw Dr. Melburn Popper once was told to follow-up on as neeeded  Chest x-ray -n/a  EKG -  Stress Test -denies  ECHO - 2015 Cardiac Cath - denies  Sleep Study -n/a  CPAP -n/a   Fasting Blood Sugar - n/a Checks Blood Sugar _n/a____ times a day  Blood Thinner Instructions:n/a Aspirin Instructions:n/a  Anesthesia review: no  Patient denies shortness of breath, fever, cough and chest pain at PAT appointment   Patient verbalized understanding of instructions that were given to them at the PAT appointment. Patient was also instructed that they will need to review over the PAT instructions again at home before surgery.

## 2018-10-08 ENCOUNTER — Encounter (HOSPITAL_COMMUNITY)
Admission: RE | Disposition: A | Discharge status: Home / Self Care | Payer: Self-pay | Source: Home / Self Care | Attending: Neurological Surgery

## 2018-10-08 ENCOUNTER — Inpatient Hospital Stay (HOSPITAL_COMMUNITY)
Admission: RE | Admit: 2018-10-08 | Discharge: 2018-10-09 | DRG: 472 | Disposition: A | Discharge status: Home / Self Care | Payer: 59 | Attending: Neurological Surgery | Admitting: Neurological Surgery

## 2018-10-08 ENCOUNTER — Inpatient Hospital Stay (HOSPITAL_COMMUNITY): Payer: 59 | Admitting: Anesthesiology

## 2018-10-08 ENCOUNTER — Inpatient Hospital Stay (HOSPITAL_COMMUNITY): Payer: 59

## 2018-10-08 ENCOUNTER — Other Ambulatory Visit: Payer: Self-pay

## 2018-10-08 ENCOUNTER — Encounter (HOSPITAL_COMMUNITY): Payer: Self-pay | Admitting: Anesthesiology

## 2018-10-08 DIAGNOSIS — M4313 Spondylolisthesis, cervicothoracic region: Secondary | ICD-10-CM | POA: Diagnosis present

## 2018-10-08 DIAGNOSIS — M5412 Radiculopathy, cervical region: Secondary | ICD-10-CM | POA: Diagnosis present

## 2018-10-08 DIAGNOSIS — M4802 Spinal stenosis, cervical region: Secondary | ICD-10-CM | POA: Diagnosis present

## 2018-10-08 DIAGNOSIS — Z79899 Other long term (current) drug therapy: Secondary | ICD-10-CM

## 2018-10-08 DIAGNOSIS — M4712 Other spondylosis with myelopathy, cervical region: Secondary | ICD-10-CM | POA: Diagnosis present

## 2018-10-08 DIAGNOSIS — Z419 Encounter for procedure for purposes other than remedying health state, unspecified: Secondary | ICD-10-CM

## 2018-10-08 DIAGNOSIS — G959 Disease of spinal cord, unspecified: Secondary | ICD-10-CM | POA: Diagnosis present

## 2018-10-08 DIAGNOSIS — I10 Essential (primary) hypertension: Secondary | ICD-10-CM | POA: Diagnosis present

## 2018-10-08 HISTORY — PX: APPLICATION OF ROBOTIC ASSISTANCE FOR SPINAL PROCEDURE: SHX6753

## 2018-10-08 HISTORY — PX: POSTERIOR CERVICAL FUSION/FORAMINOTOMY: SHX5038

## 2018-10-08 SURGERY — POSTERIOR CERVICAL FUSION/FORAMINOTOMY LEVEL 2
Anesthesia: General

## 2018-10-08 MED ORDER — MORPHINE SULFATE (PF) 2 MG/ML IV SOLN: 2.0000 mg | INTRAVENOUS | Status: DC | PRN

## 2018-10-08 MED ORDER — SODIUM CHLORIDE 0.9% FLUSH: 3.0000 mL | INTRAVENOUS | Status: DC | PRN

## 2018-10-08 MED ORDER — PHENYLEPHRINE 40 MCG/ML (10ML) SYRINGE FOR IV PUSH (FOR BLOOD PRESSURE SUPPORT)
PREFILLED_SYRINGE | INTRAVENOUS | Status: AC
  Filled 2018-10-08: qty 10

## 2018-10-08 MED ORDER — FENTANYL CITRATE (PF) 250 MCG/5ML IJ SOLN
INTRAMUSCULAR | Status: AC
  Filled 2018-10-08: qty 5

## 2018-10-08 MED ORDER — ACETAMINOPHEN 325 MG PO TABS: 650.0000 mg | ORAL_TABLET | ORAL | Status: DC | PRN

## 2018-10-08 MED ORDER — POLYETHYLENE GLYCOL 3350 17 G PO PACK: 17.0000 g | PACK | Freq: Every day | ORAL | Status: DC | PRN

## 2018-10-08 MED ORDER — FLEET ENEMA 7-19 GM/118ML RE ENEM: 1.0000 | ENEMA | Freq: Once | RECTAL | Status: DC | PRN

## 2018-10-08 MED ORDER — OXYCODONE-ACETAMINOPHEN 5-325 MG PO TABS
1.0000 | ORAL_TABLET | ORAL | Status: DC | PRN
  Administered 2018-10-08 – 2018-10-09 (×3): 2 via ORAL
  Filled 2018-10-08 (×3): qty 2

## 2018-10-08 MED ORDER — SUGAMMADEX SODIUM 200 MG/2ML IV SOLN
INTRAVENOUS | Status: DC | PRN
  Administered 2018-10-08: 200 mg via INTRAVENOUS

## 2018-10-08 MED ORDER — THROMBIN 5000 UNITS EX SOLR
CUTANEOUS | Status: AC
  Filled 2018-10-08: qty 5000

## 2018-10-08 MED ORDER — CEFAZOLIN SODIUM-DEXTROSE 2-4 GM/100ML-% IV SOLN
2.0000 g | INTRAVENOUS | Status: AC
  Administered 2018-10-08: 2 g via INTRAVENOUS

## 2018-10-08 MED ORDER — BUPIVACAINE HCL (PF) 0.5 % IJ SOLN
INTRAMUSCULAR | Status: AC
  Filled 2018-10-08: qty 30

## 2018-10-08 MED ORDER — MENTHOL 3 MG MT LOZG: 1.0000 | LOZENGE | OROMUCOSAL | Status: DC | PRN

## 2018-10-08 MED ORDER — KETOROLAC TROMETHAMINE 15 MG/ML IJ SOLN
15.0000 mg | Freq: Four times a day (QID) | INTRAMUSCULAR | Status: DC
  Administered 2018-10-08 – 2018-10-09 (×3): 15 mg via INTRAVENOUS
  Filled 2018-10-08 (×3): qty 1

## 2018-10-08 MED ORDER — METHOCARBAMOL 1000 MG/10ML IJ SOLN
500.0000 mg | Freq: Four times a day (QID) | INTRAVENOUS | Status: DC | PRN
  Filled 2018-10-08: qty 5

## 2018-10-08 MED ORDER — CEFAZOLIN SODIUM-DEXTROSE 2-4 GM/100ML-% IV SOLN
2.0000 g | Freq: Three times a day (TID) | INTRAVENOUS | Status: AC
  Administered 2018-10-08 – 2018-10-09 (×2): 2 g via INTRAVENOUS
  Filled 2018-10-08 (×2): qty 100

## 2018-10-08 MED ORDER — ONDANSETRON HCL 4 MG/2ML IJ SOLN: 4.0000 mg | Freq: Four times a day (QID) | INTRAMUSCULAR | Status: DC | PRN

## 2018-10-08 MED ORDER — BISACODYL 10 MG RE SUPP: 10.0000 mg | Freq: Every day | RECTAL | Status: DC | PRN

## 2018-10-08 MED ORDER — PHENYLEPHRINE 40 MCG/ML (10ML) SYRINGE FOR IV PUSH (FOR BLOOD PRESSURE SUPPORT)
PREFILLED_SYRINGE | INTRAVENOUS | Status: DC | PRN
Start: 2018-10-08 — End: 2018-10-08
  Administered 2018-10-08: 120 ug via INTRAVENOUS
  Administered 2018-10-08 (×2): 80 ug via INTRAVENOUS

## 2018-10-08 MED ORDER — CEFAZOLIN SODIUM-DEXTROSE 2-4 GM/100ML-% IV SOLN
INTRAVENOUS | Status: AC
  Filled 2018-10-08: qty 100

## 2018-10-08 MED ORDER — ATORVASTATIN CALCIUM 10 MG PO TABS
10.0000 mg | ORAL_TABLET | Freq: Every day | ORAL | Status: DC
  Administered 2018-10-08: 10 mg via ORAL
  Filled 2018-10-08: qty 1

## 2018-10-08 MED ORDER — CHLORHEXIDINE GLUCONATE CLOTH 2 % EX PADS: 6.0000 | MEDICATED_PAD | Freq: Once | CUTANEOUS | Status: DC

## 2018-10-08 MED ORDER — THROMBIN 5000 UNITS EX SOLR
OROMUCOSAL | Status: DC | PRN
  Administered 2018-10-08 (×3): via TOPICAL

## 2018-10-08 MED ORDER — LACTATED RINGERS IV SOLN: INTRAVENOUS | Status: DC

## 2018-10-08 MED ORDER — SODIUM CHLORIDE 0.9 % IV SOLN
INTRAVENOUS | Status: DC | PRN
  Administered 2018-10-08: 15:00:00

## 2018-10-08 MED ORDER — ACETAMINOPHEN 500 MG PO TABS
1000.0000 mg | ORAL_TABLET | Freq: Once | ORAL | Status: AC
  Administered 2018-10-08: 1000 mg via ORAL

## 2018-10-08 MED ORDER — SODIUM CHLORIDE 0.9 % IV SOLN: 250.0000 mL | INTRAVENOUS | Status: DC

## 2018-10-08 MED ORDER — ONDANSETRON HCL 4 MG/2ML IJ SOLN
INTRAMUSCULAR | Status: AC
  Filled 2018-10-08: qty 2

## 2018-10-08 MED ORDER — DEXAMETHASONE SODIUM PHOSPHATE 4 MG/ML IJ SOLN
INTRAMUSCULAR | Status: DC | PRN
Start: 2018-10-08 — End: 2018-10-08
  Administered 2018-10-08: 10 mg via INTRAVENOUS

## 2018-10-08 MED ORDER — FENTANYL CITRATE (PF) 100 MCG/2ML IJ SOLN
INTRAMUSCULAR | Status: DC | PRN
  Administered 2018-10-08: 100 ug via INTRAVENOUS
  Administered 2018-10-08: 50 ug via INTRAVENOUS
  Administered 2018-10-08: 100 ug via INTRAVENOUS
  Administered 2018-10-08: 50 ug via INTRAVENOUS

## 2018-10-08 MED ORDER — ACETAMINOPHEN 650 MG RE SUPP: 650.0000 mg | RECTAL | Status: DC | PRN

## 2018-10-08 MED ORDER — BUPIVACAINE HCL (PF) 0.5 % IJ SOLN
INTRAMUSCULAR | Status: DC | PRN
  Administered 2018-10-08: 5 mL

## 2018-10-08 MED ORDER — LIDOCAINE 2% (20 MG/ML) 5 ML SYRINGE
INTRAMUSCULAR | Status: DC | PRN
  Administered 2018-10-08: 80 mg via INTRAVENOUS

## 2018-10-08 MED ORDER — 0.9 % SODIUM CHLORIDE (POUR BTL) OPTIME
TOPICAL | Status: DC | PRN
  Administered 2018-10-08: 1000 mL

## 2018-10-08 MED ORDER — ROCURONIUM BROMIDE 50 MG/5ML IV SOSY
PREFILLED_SYRINGE | INTRAVENOUS | Status: AC
  Filled 2018-10-08: qty 10

## 2018-10-08 MED ORDER — DIPHENHYDRAMINE-APAP (SLEEP) 25-500 MG PO TABS: 1.0000 | ORAL_TABLET | Freq: Every evening | ORAL | Status: DC | PRN

## 2018-10-08 MED ORDER — PROPOFOL 10 MG/ML IV BOLUS
INTRAVENOUS | Status: DC | PRN
  Administered 2018-10-08: 170 mg via INTRAVENOUS

## 2018-10-08 MED ORDER — DEXAMETHASONE SODIUM PHOSPHATE 10 MG/ML IJ SOLN
INTRAMUSCULAR | Status: AC
  Filled 2018-10-08: qty 1

## 2018-10-08 MED ORDER — LACTATED RINGERS IV SOLN
INTRAVENOUS | Status: DC
  Administered 2018-10-08: 10:00:00 via INTRAVENOUS

## 2018-10-08 MED ORDER — HYDROMORPHONE HCL 1 MG/ML IJ SOLN: 0.2500 mg | INTRAMUSCULAR | Status: DC | PRN

## 2018-10-08 MED ORDER — DOCUSATE SODIUM 100 MG PO CAPS
100.0000 mg | ORAL_CAPSULE | Freq: Two times a day (BID) | ORAL | Status: DC
  Administered 2018-10-08: 100 mg via ORAL
  Filled 2018-10-08: qty 1

## 2018-10-08 MED ORDER — LIDOCAINE-EPINEPHRINE 1 %-1:100000 IJ SOLN
INTRAMUSCULAR | Status: AC
  Filled 2018-10-08: qty 1

## 2018-10-08 MED ORDER — SODIUM CHLORIDE 0.9% FLUSH: 3.0000 mL | Freq: Two times a day (BID) | INTRAVENOUS | Status: DC

## 2018-10-08 MED ORDER — SODIUM CHLORIDE 0.9 % IV SOLN
INTRAVENOUS | Status: DC | PRN
  Administered 2018-10-08: 25 ug/min via INTRAVENOUS

## 2018-10-08 MED ORDER — ONDANSETRON HCL 4 MG/2ML IJ SOLN
INTRAMUSCULAR | Status: DC | PRN
  Administered 2018-10-08: 4 mg via INTRAVENOUS

## 2018-10-08 MED ORDER — MIDAZOLAM HCL 5 MG/5ML IJ SOLN
INTRAMUSCULAR | Status: DC | PRN
  Administered 2018-10-08: 2 mg via INTRAVENOUS

## 2018-10-08 MED ORDER — LOSARTAN POTASSIUM 50 MG PO TABS
100.0000 mg | ORAL_TABLET | Freq: Every day | ORAL | Status: DC
  Administered 2018-10-08: 100 mg via ORAL
  Filled 2018-10-08: qty 2

## 2018-10-08 MED ORDER — LIDOCAINE-EPINEPHRINE 1 %-1:100000 IJ SOLN
INTRAMUSCULAR | Status: DC | PRN
  Administered 2018-10-08: 5 mL

## 2018-10-08 MED ORDER — MIDAZOLAM HCL 2 MG/2ML IJ SOLN
INTRAMUSCULAR | Status: AC
  Filled 2018-10-08: qty 2

## 2018-10-08 MED ORDER — PHENOL 1.4 % MT LIQD: 1.0000 | OROMUCOSAL | Status: DC | PRN

## 2018-10-08 MED ORDER — ROCURONIUM BROMIDE 10 MG/ML (PF) SYRINGE
PREFILLED_SYRINGE | INTRAVENOUS | Status: DC | PRN
  Administered 2018-10-08 (×3): 50 mg via INTRAVENOUS

## 2018-10-08 MED ORDER — SENNA 8.6 MG PO TABS
1.0000 | ORAL_TABLET | Freq: Two times a day (BID) | ORAL | Status: DC
  Administered 2018-10-08: 8.6 mg via ORAL
  Filled 2018-10-08: qty 1

## 2018-10-08 MED ORDER — ACETAMINOPHEN 500 MG PO TABS
ORAL_TABLET | ORAL | Status: AC
  Administered 2018-10-08: 1000 mg via ORAL
  Filled 2018-10-08: qty 2

## 2018-10-08 MED ORDER — METHOCARBAMOL 500 MG PO TABS
500.0000 mg | ORAL_TABLET | Freq: Four times a day (QID) | ORAL | Status: DC | PRN
  Administered 2018-10-08 – 2018-10-09 (×2): 500 mg via ORAL
  Filled 2018-10-08 (×2): qty 1

## 2018-10-08 MED ORDER — LACTATED RINGERS IV SOLN
INTRAVENOUS | Status: DC | PRN
  Administered 2018-10-08: 14:00:00 via INTRAVENOUS

## 2018-10-08 MED ORDER — LIDOCAINE 2% (20 MG/ML) 5 ML SYRINGE
INTRAMUSCULAR | Status: AC
  Filled 2018-10-08: qty 5

## 2018-10-08 MED ORDER — ONDANSETRON HCL 4 MG PO TABS: 4.0000 mg | ORAL_TABLET | Freq: Four times a day (QID) | ORAL | Status: DC | PRN

## 2018-10-08 SURGICAL SUPPLY — 78 items
ADH SKN CLS APL DERMABOND .7 (GAUZE/BANDAGES/DRESSINGS) ×1
APL SKNCLS STERI-STRIP NONHPOA (GAUZE/BANDAGES/DRESSINGS) ×1
BAG DECANTER FOR FLEXI CONT (MISCELLANEOUS) ×2 IMPLANT
BENZOIN TINCTURE PRP APPL 2/3 (GAUZE/BANDAGES/DRESSINGS) ×1 IMPLANT
BIT DRILL LONG 3.0X30 (BIT) ×1 IMPLANT
BIT DRILL LONG 3X80 (BIT) IMPLANT
BIT DRILL LONG 4X80 (BIT) IMPLANT
BIT DRILL NEURO 2X3.1 SFT TUCH (MISCELLANEOUS) ×1 IMPLANT
BIT DRILL SHORT 3.0X30 (BIT) ×1 IMPLANT
BIT DRILL SHORT 3X80 (BIT) IMPLANT
BLADE CLIPPER SURG (BLADE) IMPLANT
BLADE SURG 11 STRL SS (BLADE) ×2 IMPLANT
BLADE ULTRA TIP 2M (BLADE) IMPLANT
BUR MATCHSTICK NEURO 3.0 LAGG (BURR) ×1 IMPLANT
CANISTER SUCT 3000ML PPV (MISCELLANEOUS) ×2 IMPLANT
COVER WAND RF STERILE (DRAPES) ×2 IMPLANT
DECANTER SPIKE VIAL GLASS SM (MISCELLANEOUS) ×3 IMPLANT
DERMABOND ADVANCED (GAUZE/BANDAGES/DRESSINGS) ×1
DERMABOND ADVANCED .7 DNX12 (GAUZE/BANDAGES/DRESSINGS) IMPLANT
DEVICE DISSECT PLASMABLAD 3.0S (MISCELLANEOUS) ×1 IMPLANT
DRAPE C-ARM 42X72 X-RAY (DRAPES) ×1 IMPLANT
DRAPE C-ARMOR (DRAPES) ×1 IMPLANT
DRAPE LAPAROTOMY 100X72 PEDS (DRAPES) ×2 IMPLANT
DRAPE MICROSCOPE LEICA (MISCELLANEOUS) IMPLANT
DRAPE SHEET LG 3/4 BI-LAMINATE (DRAPES) ×2 IMPLANT
DRILL NEURO 2X3.1 SOFT TOUCH (MISCELLANEOUS) ×2
DURAPREP 26ML APPLICATOR (WOUND CARE) ×2 IMPLANT
ELECT BLADE 4.0 EZ CLEAN MEGAD (MISCELLANEOUS)
ELECT REM PT RETURN 9FT ADLT (ELECTROSURGICAL) ×2
ELECTRODE BLDE 4.0 EZ CLN MEGD (MISCELLANEOUS) IMPLANT
ELECTRODE REM PT RTRN 9FT ADLT (ELECTROSURGICAL) ×1 IMPLANT
GAUZE 4X4 16PLY RFD (DISPOSABLE) IMPLANT
GAUZE SPONGE 4X4 12PLY STRL (GAUZE/BANDAGES/DRESSINGS) IMPLANT
GLOVE BIOGEL PI IND STRL 8.5 (GLOVE) ×1 IMPLANT
GLOVE BIOGEL PI INDICATOR 8.5 (GLOVE) ×1
GLOVE ECLIPSE 8.5 STRL (GLOVE) ×2 IMPLANT
GLOVE EXAM NITRILE XL STR (GLOVE) IMPLANT
GOWN STRL REUS W/ TWL LRG LVL3 (GOWN DISPOSABLE) IMPLANT
GOWN STRL REUS W/ TWL XL LVL3 (GOWN DISPOSABLE) ×1 IMPLANT
GOWN STRL REUS W/TWL 2XL LVL3 (GOWN DISPOSABLE) ×2 IMPLANT
GOWN STRL REUS W/TWL LRG LVL3 (GOWN DISPOSABLE)
GOWN STRL REUS W/TWL XL LVL3 (GOWN DISPOSABLE) ×2
GUIDEWIRE NITINOL BEVEL TIP (WIRE) ×1 IMPLANT
HEMOSTAT POWDER KIT SURGIFOAM (HEMOSTASIS) ×4 IMPLANT
HEMOSTAT SURGICEL 2X14 (HEMOSTASIS) IMPLANT
KIT BASIN OR (CUSTOM PROCEDURE TRAY) ×2 IMPLANT
KIT INFUSE X SMALL 1.4CC (Orthopedic Implant) ×1 IMPLANT
KIT SPINE MAZOR X ROBO DISP (MISCELLANEOUS) ×2 IMPLANT
KIT TURNOVER KIT B (KITS) ×2 IMPLANT
NDL SPNL 18GX3.5 QUINCKE PK (NEEDLE) IMPLANT
NEEDLE HYPO 22GX1.5 SAFETY (NEEDLE) ×2 IMPLANT
NEEDLE SPNL 18GX3.5 QUINCKE PK (NEEDLE) ×2 IMPLANT
NS IRRIG 1000ML POUR BTL (IV SOLUTION) ×2 IMPLANT
PACK LAMINECTOMY NEURO (CUSTOM PROCEDURE TRAY) ×2 IMPLANT
PAD ARMBOARD 7.5X6 YLW CONV (MISCELLANEOUS) ×6 IMPLANT
PATTIES SURGICAL .25X.25 (GAUZE/BANDAGES/DRESSINGS) IMPLANT
PIN HEAD 2.5X60MM (PIN) IMPLANT
PIN MAYFIELD SKULL DISP (PIN) ×1 IMPLANT
PLASMABLADE 3.0S (MISCELLANEOUS) ×2
ROD VUEPOINT 80MM (Rod) ×1 IMPLANT
RUBBERBAND STERILE (MISCELLANEOUS) IMPLANT
SCREW MA 3.5X24MM (Screw) ×4 IMPLANT
SCREW SCHANZ SA 4.0MM (MISCELLANEOUS) IMPLANT
SCREW SET THREADED (Screw) ×4 IMPLANT
SPONGE LAP 4X18 RFD (DISPOSABLE) IMPLANT
STAPLER SKIN PROX WIDE 3.9 (STAPLE) ×2 IMPLANT
STRIP CLOSURE SKIN 1/2X4 (GAUZE/BANDAGES/DRESSINGS) IMPLANT
SUT ETHILON 3 0 FSL (SUTURE) IMPLANT
SUT VIC AB 0 CT1 18XCR BRD8 (SUTURE) ×1 IMPLANT
SUT VIC AB 0 CT1 8-18 (SUTURE) ×2
SUT VIC AB 2-0 CP2 18 (SUTURE) ×2 IMPLANT
SUT VIC AB 3-0 SH 8-18 (SUTURE) ×2 IMPLANT
TOWEL GREEN STERILE (TOWEL DISPOSABLE) ×2 IMPLANT
TOWEL GREEN STERILE FF (TOWEL DISPOSABLE) ×2 IMPLANT
TRAY FOLEY MTR SLVR 16FR STAT (SET/KITS/TRAYS/PACK) ×1 IMPLANT
TUBE CONNECTING 20X1/4 (TUBING) ×1 IMPLANT
TUBE MAZOR SA REDUCTION (TUBING) ×2 IMPLANT
WATER STERILE IRR 1000ML POUR (IV SOLUTION) ×2 IMPLANT

## 2018-10-08 NOTE — Transfer of Care (Signed)
Immediate Anesthesia Transfer of Care Note  Patient: Timothy Cooley  Procedure(s) Performed: Cervical Seven to Thoracic One laminectomy/foraminotomy with posterior fusion Cervical Seven to Thoracic One using Mazor (N/A ) APPLICATION OF ROBOTIC ASSISTANCE FOR SPINAL PROCEDURE (N/A )  Patient Location: PACU  Anesthesia Type:General  Level of Consciousness: awake, alert  and oriented  Airway & Oxygen Therapy: Patient Spontanous Breathing and Patient connected to nasal cannula oxygen  Post-op Assessment: Report given to RN, Post -op Vital signs reviewed and stable and Patient moving all extremities  Post vital signs: Reviewed and stable  Last Vitals:  Vitals Value Taken Time  BP 151/85 10/08/2018  5:02 PM  Temp    Pulse 82 10/08/2018  5:15 PM  Resp 14 10/08/2018  5:15 PM  SpO2 93 % 10/08/2018  5:15 PM  Vitals shown include unvalidated device data.  Last Pain:  Vitals:   10/08/18 1700  TempSrc:   PainSc: Asleep      Patients Stated Pain Goal: 2 (10/08/18 8088)  Complications: No apparent anesthesia complications

## 2018-10-08 NOTE — H&P (Signed)
CHIEF COMPLAINT: Stiffness and tightness in his lower extremities with difficulty walking distances and dysesthesias in the medial aspect of his arms for 5 months' time.  HISTORY OF PRESENT ILLNESS: [name] is a 63 year old right-handed individual whom I had seen back in 2017.  At that time, we discussed some issues in the cervical spine, and he was having some cervical radiculopathy which appeared to be caused by some foraminal stenosis at C5-6, C6-7 and C7-T1.  At that time, he was neurologically intact, and I advised conservative therapy.  He notes that the symptoms seemed to resolve themselves, but here over the last 6 months or so, he has developed significant problems with stiffness and tightness in his legs and tightness across the abdomen.  He noted a significant change in his bowel habits, and this prompted a workup by a gastroenterologist, including a CT of the abdomen, an MRI of the lumbar spine, and here more recently, an MRI of the cervical spine, as he was getting some dysesthesias into the medial aspect of his arms.  The MRI demonstrates that the patient now has evidence of a spondylolisthesis at the C7-T1 level with advanced disc degeneration at that level.  There is significant cord compression with cord signal edema that is noted on the T2 weighted images.  There is a high-grade stenosis secondary to both the spondylolisthesis and buckling of the interspinous ligament posteriorly.     Today, in the office, to further his workup, I obtained a lateral flexion-extension film of the cervical spine in addition to a singular AP view.  The coronal alignment of his neck is normal, and the sagittal alignment is normal also with good motion between flexion and extension.  However, C7-T1 is difficult to visualize, as he has prominent shoulders and a relatively elevated manubrium.  PAST MEDICAL HISTORY: Reveals that his general health has been excellent.  He notes some  hypertension.  CURRENT MEDICATIONS: Include Losartan and Atorvastatin.  ALLERGIES: He notes no allergies to any medications.  REVIEW OF SYSTEMS: Systems review is notable for some balance disturbance, abdominal pain, leg weakness and difficulty with some coordination in his arms and his legs of recent note.  PHYSICAL EXAMINATION: I note that the patient appears to have good strength in his upper extremities.  Tone and bulk in the major muscle groups appears intact.  Intrinsic function is normal, and tone and bulk there is normal also.  In the lower extremities, I note that his gait has a marked spasticity to it with significant scissoring in the proximal and distal lower extremities.  He is able to tandem walk.  However, on testing his reflexes.  He has 3+ reflexes in both patellae, 3+ reflexes in both Achilles, positive Babinski on the right, negative on the left.  Sensation appears markedly diminished to vibratory sensation in distal lower extremities.  In the upper extremities, he has a trace reflex of the biceps on the right, 2+ reflex of the biceps on the left, trace reflexes in both triceps and no brachioradialis reflex.  IMPRESSION AND PLAN: The patient has a significant cervical myelopathy at the C7-T1 level, as evidenced by his recent MRI.  I noted that this process needs to be decompressed surgically, and I discussed the concerns from a surgical perspective.  The easiest thing would be to consider an anterior decompression and a 1-level arthrodesis.  However, given the patient's anatomy, I believe that it would be difficult to access C7-T1 anteriorly and do an adequate decompression and  fusion without disturbing or potentially worsening his spondylolisthesis.  In that regard, he would likely be best served with a posterior decompression via laminectomy, foraminotomy and posterior fixation.  This is a somewhat more involved surgery and may require fusion to C6, depending on the nature of the  bony structure at the C7 vertebra.  I have advised that we should proceed with a CT scan of the cervical spine to better identify the anatomy at those levels to identify which would be the better surgical approach and how much fixation needs to be performed.  In some situations, an anterior decompression is required if there is significant disc material within the canal, but if we can do it with a single stage posterior operation, it would be to his best advantage.  He is now admitted for posterior decompression and fusion.

## 2018-10-08 NOTE — Anesthesia Procedure Notes (Signed)
Arterial Line Insertion Start/End1/28/2020 10:30 AM, 10/08/2018 10:34 AM Performed by: Montez Morita, April W, CRNA, CRNA  Preanesthetic checklist: patient identified, IV checked, site marked, risks and benefits discussed, surgical consent, monitors and equipment checked, pre-op evaluation, timeout performed and anesthesia consent Lidocaine 1% used for infiltration Right, radial was placed Catheter size: 20 G Hand hygiene performed  and maximum sterile barriers used   Attempts: 1 Procedure performed without using ultrasound guided technique. Following insertion, dressing applied and Biopatch. Post procedure assessment: normal  Patient tolerated the procedure well with no immediate complications.

## 2018-10-08 NOTE — Anesthesia Preprocedure Evaluation (Addendum)
Anesthesia Evaluation  Patient identified by MRN, date of birth, ID band Patient awake    Reviewed: Allergy & Precautions, NPO status , Patient's Chart, lab work & pertinent test results  Airway Mallampati: I  TM Distance: >3 FB Neck ROM: Full    Dental no notable dental hx. (+) Teeth Intact, Dental Advisory Given   Pulmonary neg pulmonary ROS,    Pulmonary exam normal breath sounds clear to auscultation       Cardiovascular hypertension, negative cardio ROS Normal cardiovascular exam Rhythm:Regular Rate:Normal  TTE 2015 EF 60-65%, Grade 2 DD, no valvular abnormalities   Neuro/Psych negative psych ROS   GI/Hepatic negative GI ROS, Neg liver ROS,   Endo/Other  negative endocrine ROS  Renal/GU negative Renal ROS  negative genitourinary   Musculoskeletal negative musculoskeletal ROS (+)   Abdominal   Peds  Hematology negative hematology ROS (+)   Anesthesia Other Findings   Reproductive/Obstetrics                            Anesthesia Physical Anesthesia Plan  ASA: II  Anesthesia Plan: General   Post-op Pain Management:    Induction: Intravenous  PONV Risk Score and Plan: 2  Airway Management Planned: Oral ETT  Additional Equipment: Arterial line  Intra-op Plan:   Post-operative Plan: Extubation in OR  Informed Consent: I have reviewed the patients History and Physical, chart, labs and discussed the procedure including the risks, benefits and alternatives for the proposed anesthesia with the patient or authorized representative who has indicated his/her understanding and acceptance.     Dental advisory given  Plan Discussed with: CRNA  Anesthesia Plan Comments:         Anesthesia Quick Evaluation

## 2018-10-08 NOTE — Progress Notes (Signed)
Patient ID: Timothy Cooley, male   DOB: Feb 10, 1956, 63 y.o.   MRN: [id number] Vital signs are stable Patient is awake and alert He is moving his upper and lower extremities well He is reasonably comfortable

## 2018-10-08 NOTE — Anesthesia Procedure Notes (Signed)
Procedure Name: Intubation Date/Time: 10/08/2018 1:26 PM Performed by: Montez Moritaarter, Marvena Tally W, CRNA Pre-anesthesia Checklist: Patient identified, Emergency Drugs available, Suction available and Patient being monitored Patient Re-evaluated:Patient Re-evaluated prior to induction Oxygen Delivery Method: Circle system utilized Preoxygenation: Pre-oxygenation with 100% oxygen Induction Type: IV induction Ventilation: Mask ventilation without difficulty Laryngoscope Size: Miller and 2 Grade View: Grade I Tube type: Oral Tube size: 7.5 mm Number of attempts: 1 Airway Equipment and Method: Stylet and Oral airway Placement Confirmation: ETT inserted through vocal cords under direct vision,  positive ETCO2 and breath sounds checked- equal and bilateral Secured at: 23 cm Tube secured with: Tape Dental Injury: Teeth and Oropharynx as per pre-operative assessment

## 2018-10-08 NOTE — Op Note (Signed)
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  Robotic assisted pedicle screw placement C7-T1 with posterior lateral arthrodesis using local autograft and infuse. Surgeon: Barnett Abu First assistant: Ervin Knack, MD Anesthesia: General endotracheal Indications: Timothy Cooley is a 63 year old individual whose had significant cervical spondylitic myelopathy with evidence of weakness in his legs said increasing cramping over the last several months and a recent MRI demonstrates cord compression at the C7-T1 level with cord signal changes.  He has no cervical radiculopathy to complain of.  He has been advised regarding the need for surgical decompression and stabilization of C7-T1 via posterior approach.  Procedure: The patient was brought to the operating room supine on the stretcher.  After the smooth induction of general endotracheal anesthesia, he was turned prone on the Waldwick table with the head in the proneze head holder.  The back of the neck was prepped with alcohol DuraPrep and draped in a sterile fashion.  A midline incision was created in the lower portion of the cervical spine and for spinous process that could be identified was noted be that of C5 the dissection was carefully carried out inferiorly to expose all the way down to T1.  Then when an adequate lateral exposure was obtained a central spinous process clamp was placed on C7 and T1.  The robotic arm was then brought into position.  Using registration radiographs in the AP and oblique projections the x-rays were correlated to the preplanning CT scan where screw placement was chosen at the C7 and T1 levels via pedicle entry site.  The robot was then used to place guide holes in the pedicles of C7 and T1 to a depth of 20 mm.  The robotic arm was then removed  and 24 mm long 3.5 mm diameter screws were placed into the pedicles of C7 and T1.  At this point the lateral recesses were decompressed and the facets were entered and decorticated.  This was done bilaterally.  Next a laminectomy was undertaken removing the inferior margin lamina of C7 out to the lateral recesses and the facets at C7-T1 were decompressed into the first foramen proximally.  Common dural tube was decompressed at C7 and T1.  Once this was achieved Short precontoured rods were used to connect the screw heads together and this was tightened in a neutral construct.  Lateral gutters which had been decorticated were then packed with strips of infuse in the local autograft from the bony resection and the laminectomy.  This was packed laterally and then hemostasis in the soft tissues was checked meticulously.  Full cervical dorsal fascia was then closed with #1 Vicryl in interrupted fashion and 2-0 Vicryl was used in the subcutaneous tissues 3-0 Vicryl was used to close the subcuticular skin and Dermabond was applied to the skin blood loss for the entire procedure was estimated at approximately 150 cc.

## 2018-10-09 ENCOUNTER — Encounter (HOSPITAL_COMMUNITY): Payer: Self-pay | Admitting: Neurological Surgery

## 2018-10-09 MED ORDER — DIAZEPAM 5 MG PO TABS: 5.0000 mg | ORAL_TABLET | Freq: Four times a day (QID) | ORAL | 0 refills | Status: DC | PRN

## 2018-10-09 MED ORDER — OXYCODONE-ACETAMINOPHEN 5-325 MG PO TABS: 1.0000 | ORAL_TABLET | ORAL | 0 refills | Status: DC | PRN

## 2018-10-09 MED FILL — Gelatin Absorbable MT Powder: OROMUCOSAL | Qty: 1 | Status: AC

## 2018-10-09 MED FILL — Thrombin For Soln 5000 Unit: CUTANEOUS | Qty: 5000 | Status: AC

## 2018-10-09 NOTE — Progress Notes (Signed)
Patient alert and oriented, mae's well, voiding adequate amount of urine, swallowing without difficulty, no c/o pain at time of discharge. Patient discharged home with family. Script and discharged instructions given to patient. Patient and family stated understanding of instructions given. Patient has an appointment with Dr. Elsner  

## 2018-10-09 NOTE — Discharge Summary (Signed)
Physician Discharge Summary  Patient ID: Timothy Cooley MRN: [id number] DOB/AGE: 1955-10-17 63 y.o.  Admit date: 10/08/2018 Discharge date: 10/09/2018  Admission Diagnoses: Cervical spondylosis with myelopathy C7-T1  Discharge Diagnoses: Spondylosis with myelopathy C7-T1 Active Problems:   Cervical myelopathy [facility])   Discharged Condition: good  Hospital Course: Patient was admitted to undergo surgical decompression and stabilization at C7-T1 which he tolerated well.  Consults: None  Significant Diagnostic Studies: None  Treatments: surgery: Posterior decompression C7-T1 with pedicle screw fixation C7-T1 posterior lateral arthrodesis using local autograft and infuse  Discharge Exam: Blood pressure 140/84, pulse 83, temperature 98.2 F (36.8 C), temperature source Oral, resp. rate 20, height 6' 1.5" (1.867 m), weight 99.8 kg, SpO2 96 %. Incision is clean and dry motor function is intact in the lower extremities and upper extremities.  Disposition: Discharge disposition: 01-Home or Self Care       Discharge Instructions    Call MD for:  redness, tenderness, or signs of infection (pain, swelling, redness, odor or green/yellow discharge around incision site)   Complete by:  As directed    Call MD for:  severe uncontrolled pain   Complete by:  As directed    Call MD for:  temperature >100.4   Complete by:  As directed    Diet - low sodium heart healthy   Complete by:  As directed    Discharge instructions   Complete by:  As directed    Okay to shower. Do not apply salves or appointments to incision. No heavy lifting with the upper extremities greater than 15 pounds. May resume driving when not requiring pain medication and patient feels comfortable with doing so.   Incentive spirometry RT   Complete by:  As directed    Increase activity slowly   Complete by:  As directed      Allergies as of 10/09/2018   No Known Allergies     Medication List    TAKE these  medications   atorvastatin 10 MG tablet Commonly known as:  LIPITOR Take 10 mg by mouth daily.   diazepam 5 MG tablet Commonly known as:  VALIUM Take 1 tablet (5 mg total) by mouth every 6 (six) hours as needed for muscle spasms.   diphenhydramine-acetaminophen 25-500 MG Tabs tablet Commonly known as:  TYLENOL PM Take 1-2 tablets by mouth at bedtime as needed (for sleep).   losartan 100 MG tablet Commonly known as:  COZAAR Take 100 mg by mouth daily.   MULTIVITAMIN MEN 50+ PO Take 1 tablet by mouth once a week.   oxyCODONE-acetaminophen 5-325 MG tablet Commonly known as:  PERCOCET/ROXICET Take 1-2 tablets by mouth every 3 (three) hours as needed for moderate pain or severe pain.        Signed: Shary Key Severina Sykora 10/09/2018, 9:13 AM

## 2018-10-09 NOTE — Evaluation (Signed)
Occupational Therapy Evaluation Patient Details Name: Timothy Cooley MRN: [id number] DOB: 1955-10-02 Today's Date: 10/09/2018    History of Present Illness Pt is a 63 yo male s/p cervical radiculopathy requiring cervical laminectomy C7to T1 and decompression of spinal cord. PMHx: HTN.   Clinical Impression   Pt is a 63 yo male s/p above dx. Back handout provided and reviewed adls in detail. Pt educated on: clothing between brace, never sleep in brace, set an alarm at night for medication, avoid sitting for long periods of time, correct bed positioning for sleeping, correct sequence for bed mobility, avoiding lifting more than 5 pounds and never wash directly over incision. All education is complete and patient indicates understanding. Pt climbing stairs with Modified independence and fair balance. Pt able to donn/doff brace efficiently. Pt does not require OT follow-up. OT signing off.      Follow Up Recommendations  No OT follow up;Supervision - Intermittent    Equipment Recommendations  None recommended by OT    Recommendations for Other Services       Precautions / Restrictions Precautions Precautions: Cervical Precaution Booklet Issued: Yes (comment) Required Braces or Orthoses: Cervical Brace Restrictions Weight Bearing Restrictions: No      Mobility Bed Mobility Overal bed mobility: Modified Independent             General bed mobility comments: described log roll technique to decrease pain; pt reported that he will be resting in a lift chair  Transfers Overall transfer level: Modified independent               General transfer comment: Mod I, no AD needed    Balance Overall balance assessment: Modified Independent                                         ADL either performed or assessed with clinical judgement   ADL Overall ADL's : At baseline                                       General ADL Comments: Pt able to  perform LB ADL for figure 4 technique with no AE.     Vision Baseline Vision/History: No visual deficits Patient Visual Report: No change from baseline Vision Assessment?: No apparent visual deficits     Perception     Praxis      Pertinent Vitals/Pain Pain Assessment: 0-10 Pain Score: 4  Pain Location: neck Pain Descriptors / Indicators: Jabbing Pain Intervention(s): Repositioned     Hand Dominance Right   Extremity/Trunk Assessment Upper Extremity Assessment Upper Extremity Assessment: Overall WFL for tasks assessed   Lower Extremity Assessment Lower Extremity Assessment: Overall WFL for tasks assessed   Cervical / Trunk Assessment Cervical / Trunk Assessment: Normal   Communication Communication Communication: No difficulties   Cognition Arousal/Alertness: Awake/alert Behavior During Therapy: WFL for tasks assessed/performed Overall Cognitive Status: Within Functional Limits for tasks assessed                                     General Comments       Exercises     Shoulder Instructions      Home Living Family/patient expects to be discharged to:: Private residence Living Arrangements:  Spouse/significant other Available Help at Discharge: Family Type of Home: House Home Access: Stairs to enter [employer] of Steps: 3 plus 7 from porch Entrance Stairs-Rails: Can reach both Home Layout: Multi-level;Able to live on main level with bedroom/bathroom(3 steps in home to family room and bedroom)     Bathroom Shower/Tub: Chief Strategy Officer: Standard Bathroom Accessibility: Yes              Prior Functioning/Environment Level of Independence: Independent                 OT Problem List: Pain      OT Treatment/Interventions:      OT Goals(Current goals can be found in the care plan section) Acute Rehab OT Goals OT Goal Formulation: With patient  OT Frequency:     Barriers to D/C:             Co-evaluation              AM-PAC OT "6 Clicks" Daily Activity     Outcome Measure Help from another person eating meals?: None Help from another person taking care of personal grooming?: None Help from another person toileting, which includes using toliet, bedpan, or urinal?: None Help from another person bathing (including washing, rinsing, drying)?: None Help from another person to put on and taking off regular upper body clothing?: None Help from another person to put on and taking off regular lower body clothing?: None 6 Click Score: 24   End of Session Nurse Communication: Mobility status  Activity Tolerance: Patient tolerated treatment well Patient left: in chair;with call bell/phone within reach  OT Visit Diagnosis: Unsteadiness on feet (R26.81);Muscle weakness (generalized) (M62.81)                Time: 4166-0630 OT Time Calculation (min): 25 min Charges:  OT General Charges $OT Visit: 1 Visit OT Evaluation $OT Eval Moderate Complexity: 1 Mod OT Treatments $Self Care/Home Management : 8-22 mins  Revonda Standard Cecil Cranker) Glendell Docker OTR/L Acute Rehabilitation Services Pager: [phone] Office: [phone]   Sandrea Hughs 10/09/2018, 7:49 AM

## 2018-10-09 NOTE — Evaluation (Signed)
Physical Therapy Evaluation and Discharge Patient Details Name: Timothy Cooley MRN: [id number] DOB: December 09, 1955 Today's Date: 10/09/2018   History of Present Illness  Pt is a 63 yo male s/p cervical radiculopathy requiring cervical laminectomy C7to T1 and decompression of spinal cord. PMHx: HTN.  Clinical Impression  Patient evaluated by Physical Therapy with no further acute PT needs identified. All education has been completed and the patient has no further questions. OT completed evaluation as well and reports pt mod I with mobility. OT did stair training with pt and reports pt did not require assistance. PT checked in and although pt did not require functional assistance from therapist, he had many questions regarding safe activity progression, car transfer, and role of PT in his recovery. Pt educated on these aspects and engaged throughout discussion.  See below for any follow-up Physical Therapy or equipment needs. PT is signing off. Thank you for this referral.     Follow Up Recommendations No PT follow up;Supervision - Intermittent    Equipment Recommendations   None   Recommendations for Other Services  None    Precautions / Restrictions Precautions Precautions: Cervical Precaution Booklet Issued: Yes (comment) Required Braces or Orthoses: Cervical Brace Restrictions Weight Bearing Restrictions: No      Mobility  Bed Mobility Pt in recliner upon PT arrival.   Transfers Overall transfer level: Modified independent               General transfer comment: Mod I, no AD needed per OT  Ambulation/Gait              Mod I per OT  Stairs          Mod I per OT  Wheelchair Mobility    Modified Rankin (Stroke Patients Only)       Balance Overall balance assessment: Modified Independent                                           Pertinent Vitals/Pain Pain Assessment: 0-10 Pain Score: 4  Pain Location: neck Pain Descriptors /  Indicators: Jabbing Pain Intervention(s): Monitored during session    Home Living Family/patient expects to be discharged to:: Private residence Living Arrangements: Spouse/significant other Available Help at Discharge: Family Type of Home: House Home Access: Stairs to enter Entrance Stairs-Rails: Can reach both Entrance Stairs-Number of Steps: 3 plus 7 from porch Home Layout: Multi-level;Able to live on main level with bedroom/bathroom(3 steps in home to family room and bedroom)        Prior Function Level of Independence: Independent               Hand Dominance   Dominant Hand: Right    Extremity/Trunk Assessment   Upper Extremity Assessment Upper Extremity Assessment: Overall WFL for tasks assessed    Lower Extremity Assessment Lower Extremity Assessment: Overall WFL for tasks assessed(Weakness consistent with pre-op diagnosis)    Cervical / Trunk Assessment Cervical / Trunk Assessment: Other exceptions Cervical / Trunk Exceptions: s/p surgery  Communication   Communication: No difficulties  Cognition Arousal/Alertness: Awake/alert Behavior During Therapy: WFL for tasks assessed/performed Overall Cognitive Status: Within Functional Limits for tasks assessed                                        General Comments  Exercises     Assessment/Plan    PT Assessment Patent does not need any further PT services  PT Problem List         PT Treatment Interventions      PT Goals (Current goals can be found in the Care Plan section)  Acute Rehab PT Goals PT Goal Formulation: All assessment and education complete, DC therapy    Frequency     Barriers to discharge        Co-evaluation               AM-PAC PT "6 Clicks" Mobility  Outcome Measure Help needed turning from your back to your side while in a flat bed without using bedrails?: None Help needed moving from lying on your back to sitting on the side of a flat bed  without using bedrails?: None Help needed moving to and from a bed to a chair (including a wheelchair)?: None Help needed standing up from a chair using your arms (e.g., wheelchair or bedside chair)?: None Help needed to walk in hospital room?: None Help needed climbing 3-5 steps with a railing? : None 6 Click Score: 24    End of Session         PT Visit Diagnosis: Pain;Other symptoms and signs involving the nervous system (R29.898) Pain - part of body: (neck)    Time: 2355-7322 PT Time Calculation (min) (ACUTE ONLY): 10 min   Charges:   PT Evaluation $PT Eval Low Complexity: 1 Low          Conni Slipper, PT, DPT Acute Rehabilitation Services Pager: [phone] Office: [phone]   Marylynn Pearson 10/09/2018, 7:54 AM

## 2018-10-09 NOTE — Anesthesia Postprocedure Evaluation (Signed)
Anesthesia Post Note  Patient: Timothy Cooley  Procedure(s) Performed: Cervical Seven to Thoracic One laminectomy/foraminotomy with posterior fusion Cervical Seven to Thoracic One using Mazor (N/A ) APPLICATION OF ROBOTIC ASSISTANCE FOR SPINAL PROCEDURE (N/A )     Patient location during evaluation: PACU Anesthesia Type: General Level of consciousness: awake and alert Pain management: pain level controlled Vital Signs Assessment: post-procedure vital signs reviewed and stable Respiratory status: spontaneous breathing, nonlabored ventilation, respiratory function stable and patient connected to nasal cannula oxygen Cardiovascular status: blood pressure returned to baseline and stable Postop Assessment: no apparent nausea or vomiting Anesthetic complications: no    Last Vitals:  Vitals:   10/09/18 0405 10/09/18 0736  BP: 130/76 140/84  Pulse: 82 83  Resp: 20   Temp: (!) 36.4 C 36.8 C  SpO2: 97% 96%    Last Pain:  Vitals:   10/09/18 0736  TempSrc: Oral  PainSc:                  Timothy Cooley

## 2018-10-18 ENCOUNTER — Emergency Department (HOSPITAL_COMMUNITY): Payer: 59

## 2018-10-18 ENCOUNTER — Inpatient Hospital Stay (HOSPITAL_COMMUNITY)
Admission: EM | Admit: 2018-10-18 | Discharge: 2018-10-20 | DRG: 176 | Disposition: A | Discharge status: Home / Self Care | Payer: 59 | Attending: Family Medicine | Admitting: Family Medicine

## 2018-10-18 ENCOUNTER — Other Ambulatory Visit: Payer: Self-pay

## 2018-10-18 ENCOUNTER — Encounter (HOSPITAL_COMMUNITY): Payer: Self-pay | Admitting: Emergency Medicine

## 2018-10-18 DIAGNOSIS — R7989 Other specified abnormal findings of blood chemistry: Secondary | ICD-10-CM | POA: Diagnosis not present

## 2018-10-18 DIAGNOSIS — I2692 Saddle embolus of pulmonary artery without acute cor pulmonale: Principal | ICD-10-CM | POA: Diagnosis present

## 2018-10-18 DIAGNOSIS — I82431 Acute embolism and thrombosis of right popliteal vein: Secondary | ICD-10-CM | POA: Diagnosis present

## 2018-10-18 DIAGNOSIS — I10 Essential (primary) hypertension: Secondary | ICD-10-CM | POA: Diagnosis not present

## 2018-10-18 DIAGNOSIS — I110 Hypertensive heart disease with heart failure: Secondary | ICD-10-CM | POA: Diagnosis present

## 2018-10-18 DIAGNOSIS — I5032 Chronic diastolic (congestive) heart failure: Secondary | ICD-10-CM | POA: Diagnosis present

## 2018-10-18 DIAGNOSIS — Z79899 Other long term (current) drug therapy: Secondary | ICD-10-CM

## 2018-10-18 DIAGNOSIS — R778 Other specified abnormalities of plasma proteins: Secondary | ICD-10-CM | POA: Diagnosis present

## 2018-10-18 DIAGNOSIS — I2699 Other pulmonary embolism without acute cor pulmonale: Secondary | ICD-10-CM | POA: Diagnosis present

## 2018-10-18 DIAGNOSIS — E785 Hyperlipidemia, unspecified: Secondary | ICD-10-CM | POA: Diagnosis present

## 2018-10-18 DIAGNOSIS — Z981 Arthrodesis status: Secondary | ICD-10-CM

## 2018-10-18 DIAGNOSIS — Z8249 Family history of ischemic heart disease and other diseases of the circulatory system: Secondary | ICD-10-CM

## 2018-10-18 DIAGNOSIS — F419 Anxiety disorder, unspecified: Secondary | ICD-10-CM | POA: Diagnosis present

## 2018-10-18 LAB — TROPONIN I
Troponin I: 0.48 ng/mL (ref <0.03)
Troponin I: 0.59 ng/mL (ref <0.03)

## 2018-10-18 LAB — CBC WITH DIFFERENTIAL/PLATELET
Abs Immature Granulocytes: 0.02 10*3/uL (ref 0.00–0.07)
Basophils Absolute: 0 10*3/uL (ref 0.0–0.1)
Basophils Relative: 1 %
Eosinophils Absolute: 0.2 10*3/uL (ref 0.0–0.5)
Eosinophils Relative: 3 %
HEMATOCRIT: 44.3 % (ref 39.0–52.0)
Hemoglobin: 14.1 g/dL (ref 13.0–17.0)
Immature Granulocytes: 0 %
Lymphocytes Relative: 20 %
Lymphs Abs: 1.6 10*3/uL (ref 0.7–4.0)
MCH: 28 pg (ref 26.0–34.0)
MCHC: 31.8 g/dL (ref 30.0–36.0)
MCV: 87.9 fL (ref 80.0–100.0)
MONO ABS: 0.9 10*3/uL (ref 0.1–1.0)
MONOS PCT: 11 %
Neutro Abs: 5.2 10*3/uL (ref 1.7–7.7)
Neutrophils Relative %: 65 %
Platelets: 222 10*3/uL (ref 150–400)
RBC: 5.04 MIL/uL (ref 4.22–5.81)
RDW: 13.4 % (ref 11.5–15.5)
WBC: 8 10*3/uL (ref 4.0–10.5)
nRBC: 0 % (ref 0.0–0.2)

## 2018-10-18 LAB — COMPREHENSIVE METABOLIC PANEL
ALT: 18 U/L (ref 0–44)
AST: 39 U/L (ref 15–41)
Albumin: 3.7 g/dL (ref 3.5–5.0)
Alkaline Phosphatase: 68 U/L (ref 38–126)
Anion gap: 13 (ref 5–15)
BUN: 13 mg/dL (ref 8–23)
CALCIUM: 9.7 mg/dL (ref 8.9–10.3)
CO2: 25 mmol/L (ref 22–32)
Chloride: 100 mmol/L (ref 98–111)
Creatinine, Ser: 0.91 mg/dL (ref 0.61–1.24)
GFR calc Af Amer: >60 mL/min (ref >60)
Glucose, Bld: 92 mg/dL (ref 70–99)
Potassium: 5 mmol/L (ref 3.5–5.1)
Sodium: 138 mmol/L (ref 135–145)
Total Bilirubin: 1.2 mg/dL (ref 0.3–1.2)
Total Protein: 7.1 g/dL (ref 6.5–8.1)

## 2018-10-18 LAB — BRAIN NATRIURETIC PEPTIDE: B Natriuretic Peptide: 14.1 pg/mL (ref 0.0–100.0)

## 2018-10-18 MED ORDER — DIAZEPAM 5 MG PO TABS
5.0000 mg | ORAL_TABLET | Freq: Four times a day (QID) | ORAL | Status: DC | PRN
  Administered 2018-10-19: 5 mg via ORAL
  Filled 2018-10-18: qty 1

## 2018-10-18 MED ORDER — IOPAMIDOL (ISOVUE-370) INJECTION 76%
75.0000 mL | Freq: Once | INTRAVENOUS | Status: AC | PRN
  Administered 2018-10-18: 75 mL via INTRAVENOUS

## 2018-10-18 MED ORDER — ONDANSETRON HCL 4 MG PO TABS: 4.0000 mg | ORAL_TABLET | Freq: Four times a day (QID) | ORAL | Status: DC | PRN

## 2018-10-18 MED ORDER — ACETAMINOPHEN 325 MG PO TABS: 650.0000 mg | ORAL_TABLET | Freq: Four times a day (QID) | ORAL | Status: DC | PRN

## 2018-10-18 MED ORDER — SODIUM CHLORIDE 0.9 % IV SOLN
INTRAVENOUS | Status: AC
  Administered 2018-10-18: 22:00:00 via INTRAVENOUS

## 2018-10-18 MED ORDER — ONDANSETRON HCL 4 MG/2ML IJ SOLN: 4.0000 mg | Freq: Four times a day (QID) | INTRAMUSCULAR | Status: DC | PRN

## 2018-10-18 MED ORDER — ACETAMINOPHEN 650 MG RE SUPP: 650.0000 mg | Freq: Four times a day (QID) | RECTAL | Status: DC | PRN

## 2018-10-18 MED ORDER — OXYCODONE-ACETAMINOPHEN 5-325 MG PO TABS
1.0000 | ORAL_TABLET | ORAL | Status: DC | PRN
  Administered 2018-10-18 – 2018-10-19 (×3): 2 via ORAL
  Filled 2018-10-18 (×3): qty 2

## 2018-10-18 MED ORDER — FENTANYL CITRATE (PF) 100 MCG/2ML IJ SOLN
50.0000 ug | Freq: Once | INTRAMUSCULAR | Status: AC
  Administered 2018-10-18: 50 ug via INTRAVENOUS
  Filled 2018-10-18: qty 2

## 2018-10-18 MED ORDER — ATORVASTATIN CALCIUM 10 MG PO TABS
10.0000 mg | ORAL_TABLET | Freq: Every day | ORAL | Status: DC
  Administered 2018-10-19 – 2018-10-20 (×2): 10 mg via ORAL
  Filled 2018-10-18 (×2): qty 1

## 2018-10-18 MED ORDER — IOPAMIDOL (ISOVUE-370) INJECTION 76%
INTRAVENOUS | Status: AC
  Filled 2018-10-18: qty 100

## 2018-10-18 MED ORDER — HEPARIN (PORCINE) 25000 UT/250ML-% IV SOLN: 12.0000 [IU]/kg/h | INTRAVENOUS | Status: DC

## 2018-10-18 MED ORDER — HEPARIN BOLUS VIA INFUSION
7000.0000 [IU] | Freq: Once | INTRAVENOUS | Status: AC
  Administered 2018-10-18: 7000 [IU] via INTRAVENOUS
  Filled 2018-10-18: qty 7000

## 2018-10-18 MED ORDER — HEPARIN (PORCINE) 25000 UT/250ML-% IV SOLN
1700.0000 [IU]/h | INTRAVENOUS | Status: AC
  Administered 2018-10-18: 1800 [IU]/h via INTRAVENOUS
  Administered 2018-10-19 (×2): 1700 [IU]/h via INTRAVENOUS
  Filled 2018-10-18 (×3): qty 250

## 2018-10-18 NOTE — H&P (Signed)
Timothy Cooley ZOX:[id number] DOB: 10/09/1955 DOA: 10/18/2018     PCP: Nila Nephew, MD   Outpatient Specialists:      GI Dr. Darnelle Catalan  [facility]  ) Urology  NEurosurgery Timothy Cooley Patient arrived to ER on 10/18/18 at 1719  Patient coming from: home Lives With family    Chief Complaint:  Shortness of breath HPI: Timothy Cooley is a 63 y.o. male with medical history significant of HLD, HTN cervical radiculopathy which appeared to be caused by some foraminal stenosis at C5-6, C6-7 and C7-T1.     Presented  after surgery on 1/28 (c7-t1 decomp.) with acute onset sob while he got up to go for a walk at 3PM No chest pain Some back pain at the site of the recent operation Leg swelling right worse than left Pt  Traveled to [place] by plain flight less than 2h on Jan  No hx of Clot   Regarding pertinent Chronic problems:  Hx of HTN on cozaar 100mg  a day reports compliance  Drinks 2 glasses of wine a day no hx of DT   While in ER:  Noted to be tachy Right calf bigger CTA : Bilateral PE sub massive with RHS Started on Heparin   PCCM aware  The following Work up has been ordered so far:  Orders Placed This Encounter  Procedures  . DG Chest 2 View  . CT Angio Chest PE W and/or Wo Contrast  . CBC with Differential/Platelet  . Comprehensive metabolic panel  . Brain natriuretic peptide  . Troponin I - ONCE - STAT  . Heparin level (unfractionated)  . Heparin level (unfractionated)  . CBC  . Consult to neurosurgery  . Consult to hospitalist  . heparin per pharmacy consult  . EKG 12-Lead     Following Medications were ordered in ER: Medications  iopamidol (ISOVUE-370) 76 % injection (has no administration in time range)  heparin ADULT infusion 100 units/mL (25000 units/24mL sodium chloride 0.45%) (has no administration in time range)  heparin bolus via infusion 7,000 Units (has no administration in time range)  fentaNYL (SUBLIMAZE) injection 50 mcg (50 mcg  Intravenous Given 10/18/18 1914)  iopamidol (ISOVUE-370) 76 % injection 75 mL (75 mLs Intravenous Contrast Given 10/18/18 1938)    Significant initial  Findings: Abnormal Labs Reviewed  TROPONIN I - Abnormal; Notable for the following components:      Result Value   Troponin I 0.48 (*)    All other components within normal limits     Lactic Acid, Venous No results found for: LATICACIDVEN  Na 138 K 5.0  Cr   stable,    Lab Results  Component Value Date   CREATININE 0.91 10/18/2018   CREATININE 0.96 10/04/2018   CREATININE 1.10 09/06/2014      WBC  8.0  HG/HCT  stable,       Component Value Date/Time   HGB 14.1 10/18/2018 1743   HCT 44.3 10/18/2018 1743     BNP 14  Troponin (Point of Care Test) No results for input(s): TROPIPOC in the last 72 hours.    BNP (last 3 results) Recent Labs    10/18/18 1743  BNP 14.1    ProBNP (last 3 results) No results for input(s): PROBNP in the last 8760 hours.    UA   not ordered    CXR -  NON acute CTA - extensive bilateral acute PE with CT evidence of right heart strain (RV/LV Ratio = 2.52) consistent with at  least submassive (intermediate risk) PE. The presence of right heart strain has been associated with an increased risk of morbidity and mortality.  ECG:  Personally reviewed by me showing: HR : 103 Rhythm:  Sinus tachycardia    no evidence of ischemic changes QTC 444      ED Triage Vitals  Enc Vitals Group     BP 10/18/18 1732 (!) 149/91     Pulse Rate 10/18/18 1732 (!) 104     Resp 10/18/18 1732 20     Temp 10/18/18 1732 (!) 97.5 F (36.4 C)     Temp Source 10/18/18 1732 Oral     SpO2 10/18/18 1732 96 %     Weight 10/18/18 1726 220 lb (99.8 kg)     Height 10/18/18 1726 6' 1.5" (1.867 m)     Head Circumference --      Peak Flow --      Pain Score 10/18/18 1726 0     Pain Loc --      Pain Edu? --      Excl. in GC? --   TMAX(24)@       Latest  Blood pressure (!) 150/95, pulse (!) 101,  temperature (!) 97.5 F (36.4 C), temperature source Oral, resp. rate 15, height 6' 1.5" (1.867 m), weight 99.8 kg, SpO2 92 %.    Hospitalist was called for admission for submassive PE   Review of Systems:    Pertinent positives include:  shortness of breath at rest  Constitutional:  No weight loss, night sweats, Fevers, chills, fatigue, weight loss  HEENT:  No headaches, Difficulty swallowing,Tooth/dental problems,Sore throat,  No sneezing, itching, ear ache, nasal congestion, post nasal drip,  Cardio-vascular:  No chest pain, Orthopnea, PND, anasarca, dizziness, palpitations.no Bilateral lower extremity swelling  GI:  No heartburn, indigestion, abdominal pain, nausea, vomiting, diarrhea, change in bowel habits, loss of appetite, melena, blood in stool, hematemesis Resp:  no. No dyspnea on exertion, No excess mucus, no productive cough, No non-productive cough, No coughing up of blood.No change in color of mucus.No wheezing. Skin:  no rash or lesions. No jaundice GU:  no dysuria, change in color of urine, no urgency or frequency. No straining to urinate.  No flank pain.  Musculoskeletal:  No joint pain or no joint swelling. No decreased range of motion. No back pain.  Psych:  No change in mood or affect. No depression or anxiety. No memory loss.  Neuro: no localizing neurological complaints, no tingling, no weakness, no double vision, no gait abnormality, no slurred speech, no confusion  All systems reviewed and apart from HOPI all are negative  Past Medical History:   Past Medical History:  Diagnosis Date  . Hyperlipidemia   . Hypertension       Past Surgical History:  Procedure Laterality Date  . APPLICATION OF ROBOTIC ASSISTANCE FOR SPINAL PROCEDURE N/A 10/08/2018   Procedure: APPLICATION OF ROBOTIC ASSISTANCE FOR SPINAL PROCEDURE;  Surgeon: Barnett AbuElsner, Henry, MD;  Location: [facility] OR;  Service: Neurosurgery;  Laterality: N/A;  . NO PAST SURGERIES    . none    . POSTERIOR  CERVICAL FUSION/FORAMINOTOMY N/A 10/08/2018   Procedure: Cervical Seven to Thoracic One laminectomy/foraminotomy with posterior fusion Cervical Seven to Thoracic One using Mazor;  Surgeon: Barnett AbuElsner, Henry, MD;  [facility];  Service: Neurosurgery;  Laterality: N/A;  Cervical Seven to Thoracic One laminectomy/foraminotomy with posterior fusion Cervical Seven to Thoracic One using Mazor    Social History:  Ambulatory   Independently  reports that he has never smoked. He has never used smokeless tobacco. He reports current alcohol use of about 14.0 standard drinks of alcohol per week. He reports that he does not use drugs.     Family History:   Family History  Problem Relation Age of Onset  . Heart Problems Mother   no hx of blood clots  Allergies: No Known Allergies   Prior to Admission medications   Medication Sig Start Date End Date Taking? Authorizing Provider  atorvastatin (LIPITOR) 10 MG tablet Take 10 mg by mouth daily.    [provider]  diazepam (VALIUM) 5 MG tablet Take 1 tablet (5 mg total) by mouth every 6 (six) hours as needed for muscle spasms. 10/09/18   Barnett Abu, MD  diphenhydramine-acetaminophen (TYLENOL PM) 25-500 MG TABS tablet Take 1-2 tablets by mouth at bedtime as needed (for sleep).    [provider]  losartan (COZAAR) 100 MG tablet Take 100 mg by mouth daily. 08/23/18   [provider]  Multiple Vitamins-Minerals (MULTIVITAMIN MEN 50+ PO) Take 1 tablet by mouth once a week.    [provider]  oxyCODONE-acetaminophen (PERCOCET/ROXICET) 5-325 MG tablet Take 1-2 tablets by mouth every 3 (three) hours as needed for moderate pain or severe pain. 10/09/18   Barnett Abu, MD   Physical Exam: Blood pressure (!) 150/95, pulse (!) 101, temperature (!) 97.5 F (36.4 C), temperature source Oral, resp. rate 15, height 6' 1.5" (1.867 m), weight 99.8 kg, SpO2 92 %. 1. General:  in No  Acute distress   well -appearing neck brace in  place 2. Psychological: Alert and  Oriented 3. Head/ENT:    Dry Mucous Membranes                          Head Non traumatic, neck supple                          Normal  Dentition 4. SKIN:  decreased Skin turgor,  Skin clean Dry and intact no rash 5. Heart: Regular rate and rhythm no  Murmur, no Rub or gallop 6. Lungs:   no wheezes or crackles   7. Abdomen: Soft,  non-tender, Non distended  bowel sounds present 8. Lower extremities: no clubbing, cyanosis, right worse than left edema 9. Neurologically Grossly intact, moving all 4 extremities equally    10. MSK: Normal range of motion   LABS:     Recent Labs  Lab 10/18/18 1743  WBC 8.0  NEUTROABS 5.2  HGB 14.1  HCT 44.3  MCV 87.9  PLT 222   Basic Metabolic Panel: Recent Labs  Lab 10/18/18 1743  NA 138  K 5.0  CL 100  CO2 25  GLUCOSE 92  BUN 13  CREATININE 0.91  CALCIUM 9.7      Recent Labs  Lab 10/18/18 1743  AST 39  ALT 18  ALKPHOS 68  BILITOT 1.2  PROT 7.1  ALBUMIN 3.7   No results for input(s): LIPASE, AMYLASE in the last 168 hours. No results for input(s): AMMONIA in the last 168 hours.    HbA1C: No results for input(s): HGBA1C in the last 72 hours. CBG: No results for input(s): GLUCAP in the last 168 hours.    Urine analysis:    Component Value Date/Time   COLORURINE YELLOW 09/06/2014 1759   APPEARANCEUR CLEAR 09/06/2014 1759   LABSPEC >1.046 (H) 09/06/2014 1759   PHURINE  7.0 09/06/2014 1759   GLUCOSEU NEGATIVE 09/06/2014 1759   HGBUR NEGATIVE 09/06/2014 1759   BILIRUBINUR NEGATIVE 09/06/2014 1759   KETONESUR NEGATIVE 09/06/2014 1759   PROTEINUR NEGATIVE 09/06/2014 1759   UROBILINOGEN 0.2 09/06/2014 1759   NITRITE NEGATIVE 09/06/2014 1759   LEUKOCYTESUR NEGATIVE 09/06/2014 1759       Cultures: No results found for: SDES, SPECREQUEST, CULT, REPTSTATUS   Radiological Exams on Admission: Dg Chest 2 View  Result Date: 10/18/2018 CLINICAL DATA:  Shortness of breath on exertion.  EXAM: CHEST - 2 VIEW COMPARISON:  09/06/2014. FINDINGS: Normal sized heart. Clear lungs. Cervical spine fixation hardware. Moderate left and moderate to marked right glenohumeral joint degenerative changes. IMPRESSION: No acute abnormality. Electronically Signed   By: Beckie Salts M.D.   On: 10/18/2018 18:38   Ct Angio Chest Pe W And/or Wo Contrast  Result Date: 10/18/2018 CLINICAL DATA:  Shortness of breath today. EXAM: CT ANGIOGRAPHY CHEST WITH CONTRAST TECHNIQUE: Multidetector CT imaging of the chest was performed using the standard protocol during bolus administration of intravenous contrast. Multiplanar CT image reconstructions and MIPs were obtained to evaluate the vascular anatomy. CONTRAST:  75mL ISOVUE-370 IOPAMIDOL (ISOVUE-370) INJECTION 76% COMPARISON:  Chest radiographs obtained earlier today. FINDINGS: Cardiovascular: Multiple bilateral pulmonary emboli with small ones in the right and left main pulmonary arteries and larger ones in the lobar branches, including and almost occluding multiple branches. The left ventricular lumen is very small with a maximum diameter of 2.1 cm and pronounced diffuse left ventricular hypertrophy. The right ventricle is dilated with a maximum diameter of 5.3 cm. The right ventricular to left ventricular ratio is 2.52. Mildly enlarged pulmonary arteries with the main pulmonary artery measuring 3.4 cm in transverse diameter. Calcific coronary artery and aortic atherosclerosis. Mediastinum/Nodes: No enlarged lymph nodes. The thyroid gland is not included. Unremarkable esophagus. Lungs/Pleura: Mildly prominent interstitial markings. No pleural fluid or airspace consolidation. Mild linear atelectasis or scarring in the left lower lobe. Upper Abdomen: Unremarkable. Musculoskeletal: Minimal thoracic spine degenerative changes. Lower cervical spine fixation hardware. Review of the MIP images confirms the above findings. IMPRESSION: 1. Positive for extensive bilateral acute PE  with CT evidence of right heart strain (RV/LV Ratio = 2.52) consistent with at least submassive (intermediate risk) PE. The presence of right heart strain has been associated with an increased risk of morbidity and mortality. Please activate Code PE by paging [phone]. 2. Mildly enlarged central pulmonary arteries, suggesting pulmonary arterial hypertension. 3. Mild calcific coronary artery and aortic atherosclerosis. 4. Mild chronic interstitial lung disease or minimal interstitial pulmonary edema. Critical Value/emergent results were called by telephone at the time of interpretation on 10/18/2018 at 8:05 pm to Dr. Loren Racer , who verbally acknowledged these results. Aortic Atherosclerosis (ICD10-I70.0). Electronically Signed   By: Beckie Salts M.D.   On: 10/18/2018 20:12    Chart has been reviewed    Assessment/Plan  63 y.o. male with medical history significant of HLD, HTN cervical radiculopathy which appeared to be caused by some foraminal stenosis at C5-6, C6-7 and C7-T1.  dCHF G2,  Admitted for submassive PE  Present on Admission: . PE (pulmonary thromboembolism) (HCC) -  Admit to   stepdown Initiate heparin drip  Would likely benefit from case manager consult for long term anticoagulation Hold home blood pressure medications avoid hypotension Cycle cardiac enzymes elevated troponin worrisome for was prognostication Order echogram and lower extremity Dopplers  Most likely risk factors for hypercoagulable state being recent operation and decreased mobility   Given  large PE [facility] consulted and code PE activated, per PCCM patient not a candidate for tPA  . Elevated troponin in the setting of pulmonary embolism continue to monitor cardiac enzymes obtain echogram to evaluate for right heart strain . Hyperlipidemia stable continue home medications . HTN (hypertension) allow permissive hypertension for tonight . Chronic diastolic heart failure (HCC) stable currently slightly on the  dry side will rehydrate gently and monitor  Alcohol use suggested decrease alcohol intake to about 1/day monitor for any sign of withdrawal patient denies history of DT  Right leg edema will obtain Dopplers to evaluate for possible DVT  Other plan as per orders.  DVT prophylaxis:  heparin  Code Status:  FULL CODE   as per patient   I had personally discussed CODE STATUS with patient    Family Communication:   Family not  at  Bedside   Disposition Plan:      To home once workup is complete and patient is stable                                        Consults called:    PCCM aware  Admission status:   Obs    Level of care      SDU tele indefinitely please discontinue once patient no longer qualifies    Azra Abrell 10/19/2018, 12:17 AM    [facility]     after 2 AM please page floor coverage PA If 7AM-7PM, please contact the day team taking care of the patient using [email or web address]

## 2018-10-18 NOTE — ED Provider Notes (Signed)
[facility] EMERGENCY DEPARTMENT Provider Note   CSN: [id number] Arrival date & time: 10/18/18  1719     History   Chief Complaint No chief complaint on file.   HPI Timothy Cooley is a 63 y.o. male.  HPI Patient with recent hospitalization for C7-T1 surgical decompression.  States he has had mild fatigue since the surgery.  Got up to go walk this afternoon around 4 PM and developed acute onset shortness of breath.  He denies any chest pain.  No lightheadedness, fever or chills.  No cough.  Denies any new lower extremity swelling or pain.  Shortness of breath has improved though still minimally present. Past Medical History:  Diagnosis Date  . Hyperlipidemia   . Hypertension     Patient Active Problem List   Diagnosis Date Noted  . Cervical myelopathy (HCC) 10/08/2018  . Overweight 09/07/2014  . Vertigo   . TIA (transient ischemic attack) 09/06/2014  . Chronic diastolic heart failure (HCC) 09/06/2014  . HTN (hypertension) 09/06/2014  . Hyperlipidemia 09/06/2014  . Hypokalemia 09/06/2014    Past Surgical History:  Procedure Laterality Date  . APPLICATION OF ROBOTIC ASSISTANCE FOR SPINAL PROCEDURE N/A 10/08/2018   Procedure: APPLICATION OF ROBOTIC ASSISTANCE FOR SPINAL PROCEDURE;  Surgeon: Barnett Abu, MD;  Location: [facility] OR;  Service: Neurosurgery;  Laterality: N/A;  . NO PAST SURGERIES    . none    . POSTERIOR CERVICAL FUSION/FORAMINOTOMY N/A 10/08/2018   Procedure: Cervical Seven to Thoracic One laminectomy/foraminotomy with posterior fusion Cervical Seven to Thoracic One using Mazor;  Surgeon: Barnett Abu, MD;  Location: [facility] OR;  Service: Neurosurgery;  Laterality: N/A;  Cervical Seven to Thoracic One laminectomy/foraminotomy with posterior fusion Cervical Seven to Thoracic One using Mazor        Home Medications    Prior to Admission medications   Medication Sig Start Date End Date Taking? Authorizing Provider  atorvastatin (LIPITOR) 10 MG tablet  Take 10 mg by mouth daily.    [provider]  diazepam (VALIUM) 5 MG tablet Take 1 tablet (5 mg total) by mouth every 6 (six) hours as needed for muscle spasms. 10/09/18   Barnett Abu, MD  diphenhydramine-acetaminophen (TYLENOL PM) 25-500 MG TABS tablet Take 1-2 tablets by mouth at bedtime as needed (for sleep).    [provider]  losartan (COZAAR) 100 MG tablet Take 100 mg by mouth daily. 08/23/18   [provider]  Multiple Vitamins-Minerals (MULTIVITAMIN MEN 50+ PO) Take 1 tablet by mouth once a week.    [provider]  oxyCODONE-acetaminophen (PERCOCET/ROXICET) 5-325 MG tablet Take 1-2 tablets by mouth every 3 (three) hours as needed for moderate pain or severe pain. 10/09/18   Barnett Abu, MD    Family History Family History  Problem Relation Age of Onset  . Heart Problems Mother     Social History Social History   Tobacco Use  . Smoking status: Never Smoker  . Smokeless tobacco: Never Used  Substance Use Topics  . Alcohol use: Yes    Alcohol/week: 14.0 standard drinks    Types: 14 Glasses of wine per week  . Drug use: No     Allergies   Patient has no known allergies.   Review of Systems Review of Systems  Constitutional: Positive for fatigue. Negative for chills and fever.  HENT: Negative for tinnitus and trouble swallowing.   Respiratory: Positive for shortness of breath. Negative for cough and wheezing.   Cardiovascular: Negative for chest pain, palpitations and  leg swelling.  Gastrointestinal: Negative for abdominal pain, diarrhea, nausea and vomiting.  Genitourinary: Negative for dysuria, flank pain and frequency.  Musculoskeletal: Negative for back pain and myalgias.  Skin: Negative for rash and wound.  Neurological: Negative for dizziness, syncope, weakness, light-headedness, numbness and headaches.  All other systems reviewed and are negative.    Physical Exam Updated Vital Signs BP 137/86   Pulse (!) 104   Temp  (!) 97.5 F (36.4 C) (Oral)   Resp 15   Ht 6' 1.5" (1.867 m)   Wt 99.8 kg   SpO2 96%   BMI 28.63 kg/m   Physical Exam Vitals signs and nursing note reviewed.  Constitutional:      Appearance: Normal appearance. He is well-developed.  HENT:     Head: Normocephalic and atraumatic.     Nose: Nose normal. No congestion.  Eyes:     Extraocular Movements: Extraocular movements intact.     Pupils: Pupils are equal, round, and reactive to light.  Neck:     Comments: [place] collar in place Cardiovascular:     Rate and Rhythm: Regular rhythm. Tachycardia present.     Heart sounds: No murmur. No friction rub. No gallop.   Pulmonary:     Effort: Pulmonary effort is normal. No respiratory distress.     Breath sounds: Normal breath sounds. No stridor. No wheezing, rhonchi or rales.  Chest:     Chest wall: No tenderness.  Abdominal:     General: Bowel sounds are normal.     Palpations: Abdomen is soft.     Tenderness: There is no abdominal tenderness. There is no guarding or rebound.  Musculoskeletal: Normal range of motion.        General: No swelling, tenderness, deformity or signs of injury.     Comments: Right calf is mildly larger than left.  No calf tenderness.  Distal pulses intact.  Skin:    General: Skin is warm and dry.     Findings: No erythema or rash.  Neurological:     General: No focal deficit present.     Mental Status: He is alert and oriented to person, place, and time.     Comments: 5/5 motor in all extremities.  Sensation intact.  Psychiatric:        Mood and Affect: Mood normal.        Behavior: Behavior normal.      ED Treatments / Results  Labs (all labs ordered are listed, but only abnormal results are displayed) Labs Reviewed  TROPONIN I - Abnormal; Notable for the following components:      Result Value   Troponin I 0.48 (*)    All other components within normal limits  CBC WITH DIFFERENTIAL/PLATELET  COMPREHENSIVE METABOLIC PANEL  BRAIN  NATRIURETIC PEPTIDE  HEPARIN LEVEL (UNFRACTIONATED)  CBC    EKG EKG Interpretation  Date/Time:  Friday October 18 2018 17:29:52 EST Ventricular Rate:  103 PR Interval:    QRS Duration: 100 QT Interval:  339 QTC Calculation: 444 R Axis:   70 Text Interpretation:  Sinus tachycardia Confirmed by Loren RacerYelverton, Tanaya Dunigan ([id number]) on 10/18/2018 5:47:00 PM   Radiology Dg Chest 2 View  Result Date: 10/18/2018 CLINICAL DATA:  Shortness of breath on exertion. EXAM: CHEST - 2 VIEW COMPARISON:  09/06/2014. FINDINGS: Normal sized heart. Clear lungs. Cervical spine fixation hardware. Moderate left and moderate to marked right glenohumeral joint degenerative changes. IMPRESSION: No acute abnormality. Electronically Signed   By: Zada FindersSteven  Reid M.D.  On: 10/18/2018 18:38   Ct Angio Chest Pe W And/or Wo Contrast  Result Date: 10/18/2018 CLINICAL DATA:  Shortness of breath today. EXAM: CT ANGIOGRAPHY CHEST WITH CONTRAST TECHNIQUE: Multidetector CT imaging of the chest was performed using the standard protocol during bolus administration of intravenous contrast. Multiplanar CT image reconstructions and MIPs were obtained to evaluate the vascular anatomy. CONTRAST:  77mL ISOVUE-370 IOPAMIDOL (ISOVUE-370) INJECTION 76% COMPARISON:  Chest radiographs obtained earlier today. FINDINGS: Cardiovascular: Multiple bilateral pulmonary emboli with small ones in the right and left main pulmonary arteries and larger ones in the lobar branches, including and almost occluding multiple branches. The left ventricular lumen is very small with a maximum diameter of 2.1 cm and pronounced diffuse left ventricular hypertrophy. The right ventricle is dilated with a maximum diameter of 5.3 cm. The right ventricular to left ventricular ratio is 2.52. Mildly enlarged pulmonary arteries with the main pulmonary artery measuring 3.4 cm in transverse diameter. Calcific coronary artery and aortic atherosclerosis. Mediastinum/Nodes: No enlarged lymph  nodes. The thyroid gland is not included. Unremarkable esophagus. Lungs/Pleura: Mildly prominent interstitial markings. No pleural fluid or airspace consolidation. Mild linear atelectasis or scarring in the left lower lobe. Upper Abdomen: Unremarkable. Musculoskeletal: Minimal thoracic spine degenerative changes. Lower cervical spine fixation hardware. Review of the MIP images confirms the above findings. IMPRESSION: 1. Positive for extensive bilateral acute PE with CT evidence of right heart strain (RV/LV Ratio = 2.52) consistent with at least submassive (intermediate risk) PE. The presence of right heart strain has been associated with an increased risk of morbidity and mortality. Please activate Code PE by paging [phone]. 2. Mildly enlarged central pulmonary arteries, suggesting pulmonary arterial hypertension. 3. Mild calcific coronary artery and aortic atherosclerosis. 4. Mild chronic interstitial lung disease or minimal interstitial pulmonary edema. Critical Value/emergent results were called by telephone at the time of interpretation on 10/18/2018 at 8:05 pm to Dr. Loren Racer , who verbally acknowledged these results. Aortic Atherosclerosis (ICD10-I70.0). Electronically Signed   By: Beckie Salts M.D.   On: 10/18/2018 20:12    Procedures Procedures (including critical care time)  Medications Ordered in ED Medications  iopamidol (ISOVUE-370) 76 % injection (has no administration in time range)  heparin ADULT infusion 100 units/mL (25000 units/273mL sodium chloride 0.45%) (1,800 Units/hr Intravenous New Bag/Given 10/18/18 2114)  fentaNYL (SUBLIMAZE) injection 50 mcg (50 mcg Intravenous Given 10/18/18 1914)  iopamidol (ISOVUE-370) 76 % injection 75 mL (75 mLs Intravenous Contrast Given 10/18/18 1938)  heparin bolus via infusion 7,000 Units (7,000 Units Intravenous Bolus from Bag 10/18/18 2114)    CRITICAL CARE Performed by: Loren Racer Total critical care time: 35 minutes Critical care time  was exclusive of separately billable procedures and treating other patients. Critical care was necessary to treat or prevent imminent or life-threatening deterioration. Critical care was time spent personally by me on the following activities: development of treatment plan with patient and/or surrogate as well as nursing, discussions with consultants, evaluation of patient's response to treatment, examination of patient, obtaining history from patient or surrogate, ordering and performing treatments and interventions, ordering and review of laboratory studies, ordering and review of radiographic studies, pulse oximetry and re-evaluation of patient's condition. Initial Impression / Assessment and Plan / ED Course  I have reviewed the triage vital signs and the nursing notes.  Pertinent labs & imaging results that were available during my care of the patient were reviewed by me and considered in my medical decision making (see chart for details).  Patient is evidence of bilateral PEs on CT angiogram.  There is some right heart strain.  Discussed with critical care.  Advised anticoagulation.  Also spoke with neurosurgery regarding patient's recent recent surgery.  Cleared for anticoagulation.  Will consult on patient in the hospital.  Hospitalist to see patient and admit.  Final Clinical Impressions(s) / ED Diagnoses   Final diagnoses:  Bilateral pulmonary embolism [facility](HCC)    ED Discharge Orders    None       Loren RacerYelverton, Chandler Swiderski, MD 10/18/18 2116

## 2018-10-18 NOTE — ED Notes (Signed)
Taken to CT at this time. 

## 2018-10-18 NOTE — Progress Notes (Signed)
ANTICOAGULATION CONSULT NOTE - Initial Consult  Pharmacy Consult for heparin Indication: pulmonary embolus  No Known Allergies  Patient Measurements: Height: 6' 1.5" (186.7 cm) Weight: 220 lb (99.8 kg) IBW/kg (Calculated) : 81.05 Heparin Dosing Weight: 100 kg  Vital Signs: Temp: 97.5 F (36.4 C) (02/07 1732) Temp Source: Oral (02/07 1732) BP: 150/95 (02/07 1745) Pulse Rate: 101 (02/07 1745)  Labs: Recent Labs    10/18/18 1743  HGB 14.1  HCT 44.3  PLT 222  CREATININE 0.91  TROPONINI 0.48*    Estimated Creatinine Clearance: 104.1 mL/min (by C-G formula based on SCr of 0.91 mg/dL).  Assessment: CC/HPI: 63 yo m presenting after surgery on 1/28 (c7-t1 decomp.) with acute onset sob  PMH: HLD HTN  Anticoag: none pta iv hep for new onset pe  Renal: SCr 0.91  Pulm: CTA + for BL acute PE RHS RV LV 2.52  Heme/Onc: H&H 14.1/44.3, Plt 222  Goal of Therapy:  Heparin level 0.3-0.7 units/ml Monitor platelets by anticoagulation protocol: Yes   Plan:  Heparin bolus 7000 units x 1 Heparin gtt 1800 units/hr Initial HL 0300 Daily HL CBC F/U oral AC  Isaac Bliss, PharmD, BCPS, BCCCP Clinical Pharmacist [phone]  Please check AMION for all [facility] Pharmacy numbers  10/18/2018 8:53 PM

## 2018-10-18 NOTE — ED Triage Notes (Signed)
Pt reports having surgery on 1/28 and became SOB today when trying to go walking laps.

## 2018-10-19 ENCOUNTER — Observation Stay (HOSPITAL_BASED_OUTPATIENT_CLINIC_OR_DEPARTMENT_OTHER): Payer: 59

## 2018-10-19 DIAGNOSIS — I2699 Other pulmonary embolism without acute cor pulmonale: Secondary | ICD-10-CM

## 2018-10-19 DIAGNOSIS — Z79899 Other long term (current) drug therapy: Secondary | ICD-10-CM | POA: Diagnosis not present

## 2018-10-19 DIAGNOSIS — R0602 Shortness of breath: Secondary | ICD-10-CM | POA: Diagnosis not present

## 2018-10-19 DIAGNOSIS — Z981 Arthrodesis status: Secondary | ICD-10-CM | POA: Diagnosis not present

## 2018-10-19 DIAGNOSIS — E785 Hyperlipidemia, unspecified: Secondary | ICD-10-CM | POA: Diagnosis not present

## 2018-10-19 DIAGNOSIS — R7989 Other specified abnormal findings of blood chemistry: Secondary | ICD-10-CM | POA: Diagnosis not present

## 2018-10-19 DIAGNOSIS — I110 Hypertensive heart disease with heart failure: Secondary | ICD-10-CM | POA: Diagnosis present

## 2018-10-19 DIAGNOSIS — I10 Essential (primary) hypertension: Secondary | ICD-10-CM | POA: Diagnosis not present

## 2018-10-19 DIAGNOSIS — Z8249 Family history of ischemic heart disease and other diseases of the circulatory system: Secondary | ICD-10-CM | POA: Diagnosis not present

## 2018-10-19 DIAGNOSIS — I82431 Acute embolism and thrombosis of right popliteal vein: Secondary | ICD-10-CM | POA: Diagnosis present

## 2018-10-19 DIAGNOSIS — I2692 Saddle embolus of pulmonary artery without acute cor pulmonale: Secondary | ICD-10-CM | POA: Diagnosis present

## 2018-10-19 DIAGNOSIS — F419 Anxiety disorder, unspecified: Secondary | ICD-10-CM | POA: Diagnosis present

## 2018-10-19 DIAGNOSIS — I5032 Chronic diastolic (congestive) heart failure: Secondary | ICD-10-CM | POA: Diagnosis not present

## 2018-10-19 LAB — TROPONIN I
Troponin I: 0.53 ng/mL (ref <0.03)
Troponin I: 0.2 ng/mL (ref <0.03)

## 2018-10-19 LAB — COMPREHENSIVE METABOLIC PANEL
ALT: 29 U/L (ref 0–44)
AST: 22 U/L (ref 15–41)
Albumin: 3.1 g/dL — ABNORMAL LOW (ref 3.5–5.0)
Alkaline Phosphatase: 59 U/L (ref 38–126)
Anion gap: 10 (ref 5–15)
BUN: 10 mg/dL (ref 8–23)
CO2: 25 mmol/L (ref 22–32)
Calcium: 8.8 mg/dL — ABNORMAL LOW (ref 8.9–10.3)
Chloride: 103 mmol/L (ref 98–111)
Creatinine, Ser: 0.89 mg/dL (ref 0.61–1.24)
GFR calc Af Amer: >60 mL/min (ref >60)
Glucose, Bld: 109 mg/dL — ABNORMAL HIGH (ref 70–99)
Potassium: 4.1 mmol/L (ref 3.5–5.1)
Sodium: 138 mmol/L (ref 135–145)
Total Bilirubin: 0.5 mg/dL (ref 0.3–1.2)
Total Protein: 5.8 g/dL — ABNORMAL LOW (ref 6.5–8.1)

## 2018-10-19 LAB — CBC
HCT: 39.3 % (ref 39.0–52.0)
HCT: 40.6 % (ref 39.0–52.0)
Hemoglobin: 13 g/dL (ref 13.0–17.0)
Hemoglobin: 13.3 g/dL (ref 13.0–17.0)
MCH: 27.7 pg (ref 26.0–34.0)
MCH: 29 pg (ref 26.0–34.0)
MCHC: 32 g/dL (ref 30.0–36.0)
MCHC: 33.8 g/dL (ref 30.0–36.0)
MCV: 85.8 fL (ref 80.0–100.0)
MCV: 86.4 fL (ref 80.0–100.0)
PLATELETS: 220 10*3/uL (ref 150–400)
Platelets: 219 10*3/uL (ref 150–400)
RBC: 4.58 MIL/uL (ref 4.22–5.81)
RBC: 4.7 MIL/uL (ref 4.22–5.81)
RDW: 13.5 % (ref 11.5–15.5)
RDW: 13.6 % (ref 11.5–15.5)
WBC: 7.7 10*3/uL (ref 4.0–10.5)
WBC: 7.8 10*3/uL (ref 4.0–10.5)
nRBC: 0 % (ref 0.0–0.2)
nRBC: 0 % (ref 0.0–0.2)

## 2018-10-19 LAB — HEPARIN LEVEL (UNFRACTIONATED)
Heparin Unfractionated: 0.73 IU/mL — ABNORMAL HIGH (ref 0.30–0.70)
Heparin Unfractionated: 0.46 IU/mL (ref 0.30–0.70)

## 2018-10-19 LAB — ECHOCARDIOGRAM COMPLETE
Height: 73.5 in
Weight: 3550.4 oz

## 2018-10-19 LAB — TSH: TSH: 2.366 u[IU]/mL (ref 0.350–4.500)

## 2018-10-19 LAB — PHOSPHORUS: Phosphorus: 3.8 mg/dL (ref 2.5–4.6)

## 2018-10-19 LAB — HIV ANTIBODY (ROUTINE TESTING W REFLEX): HIV SCREEN 4TH GENERATION: NONREACTIVE

## 2018-10-19 LAB — MAGNESIUM: Magnesium: 2.1 mg/dL (ref 1.7–2.4)

## 2018-10-19 NOTE — Progress Notes (Signed)
PROGRESS NOTE    Timothy Cooley  ZOX:[id number] DOB: 1956-03-25 DOA: 10/18/2018 PCP: Nila Nephew, MD   Brief Narrative: Timothy Cooley is a 64 y.o. male with medical history significant of HLD, HTN cervical radiculopathy s/p cervical laminectomy, decompression. Patient presented secondary to shortness of breath sound to have saddle PE with evidence of right heart strain. Also positive for acute right LE DVTs.   Assessment & Plan:   Active Problems:   Chronic diastolic heart failure (HCC)   HTN (hypertension)   Hyperlipidemia   PE (pulmonary thromboembolism) (HCC)   Elevated troponin   Acute saddle PE Acute right LE DVTs In setting of recent surgery and decreased mobility. Started on heparin drip. Not a candidate for thrombolysis. Currently on room air. Transthoracic Echocardiogram with evidence of right heart strain with  -Continue heparin drip -Continue Percocet prn  Elevated troponin In setting of right heart strain from PE. Trending down.  Hyperlipidemia -Continue Lipitor  Essential hypertension Slightly hypertensive.  Chronic diastolic heart failure Euvolemic. Not on outpatient medication regimen  Anxiety -Continue diazepam prn   DVT prophylaxis: Heparin drip Code Status:   Code Status: Full Code Family Communication: Wife at bedside Disposition Plan: Discharge likely in 24-48 hours once on oral anticoagulation   Consultants:   Interventional radiology  Procedures:   Transthoracic Echocardiogram (10/19/2018) IMPRESSIONS    1. Technically challenging exam with suboptimal image quality despite the use of Definity IV contrast.  2. The left ventricle has hyperdynamic systolic function of >65%. The cavity size was normal. There is no increased left ventricular wall thickness. Left ventricular diastology could not be evaluated due to indeterminent diastolic function.  3. The right ventricle was not well visualized.  4. The right ventricle has mildly reduced  systolic function. The cavity was mildly enlarged. There is no increase in right ventricular wall thickness.  5. RVSP:25.8 mmHg.  6. The tricuspid valve is normal in structure.  7. The inferior vena cava is dilated in size with greater than 50% respiratory variability. This suggests a RA pressure estimate of 8 mmHg.  8. The mitral valve is normal in structure. There is mild calcification.  Antimicrobials:  None    Subjective: Breathing is better  Objective: Vitals:   10/18/18 2216 10/19/18 0032 10/19/18 0546 10/19/18 1419  BP: (!) 158/92 (!) 150/88 135/84 (!) 141/50  Pulse: (!) 109 100 86 81  Resp: 12 18 13    Temp: 98.1 F (36.7 C) 98.9 F (37.2 C) 98.1 F (36.7 C) 97.7 F (36.5 C)  TempSrc: Oral Oral Oral Oral  SpO2: 100%   100%  Weight:   100.7 kg   Height:        Intake/Output Summary (Last 24 hours) at 10/19/2018 1523 Last data filed at 10/19/2018 0600 Gross per 24 hour  Intake 657.43 ml  Output 450 ml  Net 207.43 ml   Filed Weights   10/18/18 1726 10/19/18 0546  Weight: 99.8 kg 100.7 kg    Examination:  General exam: Appears calm and comfortable Respiratory system: Clear to auscultation. Respiratory effort normal. Cardiovascular system: S1 & S2 heard, RRR. No murmurs, rubs, gallops or clicks. Gastrointestinal system: Abdomen is nondistended, soft and nontender. No organomegaly or masses felt. Normal bowel sounds heard. Central nervous system: Alert and oriented. No focal neurological deficits. Extremities: Right leg edema. Calf tenderness Skin: No cyanosis. No rashes Psychiatry: Judgement and insight appear normal. Mood & affect appropriate.     Data Reviewed: I have personally reviewed following labs and imaging studies  CBC: Recent Labs  Lab 10/18/18 1743 10/19/18 0338  WBC 8.0 7.8  7.7  NEUTROABS 5.2  --   HGB 14.1 13.0  13.3  HCT 44.3 40.6  39.3  MCV 87.9 86.4  85.8  PLT 222 219  220   Basic Metabolic Panel: Recent Labs  Lab  10/18/18 1743 10/19/18 0338  NA 138 138  K 5.0 4.1  CL 100 103  CO2 25 25  GLUCOSE 92 109*  BUN 13 10  CREATININE 0.91 0.89  CALCIUM 9.7 8.8*  MG  --  2.1  PHOS  --  3.8   GFR: Estimated Creatinine Clearance: 106.8 mL/min (by C-G formula based on SCr of 0.89 mg/dL). Liver Function Tests: Recent Labs  Lab 10/18/18 1743 10/19/18 0338  AST 39 22  ALT 18 29  ALKPHOS 68 59  BILITOT 1.2 0.5  PROT 7.1 5.8*  ALBUMIN 3.7 3.1*   No results for input(s): LIPASE, AMYLASE in the last 168 hours. No results for input(s): AMMONIA in the last 168 hours. Coagulation Profile: No results for input(s): INR, PROTIME in the last 168 hours. Cardiac Enzymes: Recent Labs  Lab 10/18/18 1743 10/18/18 2142 10/19/18 0338 10/19/18 1057  TROPONINI 0.48* 0.59* 0.53* 0.20*   BNP (last 3 results) No results for input(s): PROBNP in the last 8760 hours. HbA1C: No results for input(s): HGBA1C in the last 72 hours. CBG: No results for input(s): GLUCAP in the last 168 hours. Lipid Profile: No results for input(s): CHOL, HDL, LDLCALC, TRIG, CHOLHDL, LDLDIRECT in the last 72 hours. Thyroid Function Tests: Recent Labs    10/19/18 0338  TSH 2.366   Anemia Panel: No results for input(s): VITAMINB12, FOLATE, FERRITIN, TIBC, IRON, RETICCTPCT in the last 72 hours. Sepsis Labs: No results for input(s): PROCALCITON, LATICACIDVEN in the last 168 hours.  No results found for this or any previous visit (from the past 240 hour(s)).       Radiology Studies: Dg Chest 2 View  Result Date: 10/18/2018 CLINICAL DATA:  Shortness of breath on exertion. EXAM: CHEST - 2 VIEW COMPARISON:  09/06/2014. FINDINGS: Normal sized heart. Clear lungs. Cervical spine fixation hardware. Moderate left and moderate to marked right glenohumeral joint degenerative changes. IMPRESSION: No acute abnormality. Electronically Signed   By: Beckie Salts M.D.   On: 10/18/2018 18:38   Ct Angio Chest Pe W And/or Wo Contrast  Result  Date: 10/18/2018 CLINICAL DATA:  Shortness of breath today. EXAM: CT ANGIOGRAPHY CHEST WITH CONTRAST TECHNIQUE: Multidetector CT imaging of the chest was performed using the standard protocol during bolus administration of intravenous contrast. Multiplanar CT image reconstructions and MIPs were obtained to evaluate the vascular anatomy. CONTRAST:  75mL ISOVUE-370 IOPAMIDOL (ISOVUE-370) INJECTION 76% COMPARISON:  Chest radiographs obtained earlier today. FINDINGS: Cardiovascular: Multiple bilateral pulmonary emboli with small ones in the right and left main pulmonary arteries and larger ones in the lobar branches, including and almost occluding multiple branches. The left ventricular lumen is very small with a maximum diameter of 2.1 cm and pronounced diffuse left ventricular hypertrophy. The right ventricle is dilated with a maximum diameter of 5.3 cm. The right ventricular to left ventricular ratio is 2.52. Mildly enlarged pulmonary arteries with the main pulmonary artery measuring 3.4 cm in transverse diameter. Calcific coronary artery and aortic atherosclerosis. Mediastinum/Nodes: No enlarged lymph nodes. The thyroid gland is not included. Unremarkable esophagus. Lungs/Pleura: Mildly prominent interstitial markings. No pleural fluid or airspace consolidation. Mild linear atelectasis or scarring in the left lower lobe. Upper Abdomen:  Unremarkable. Musculoskeletal: Minimal thoracic spine degenerative changes. Lower cervical spine fixation hardware. Review of the MIP images confirms the above findings. IMPRESSION: 1. Positive for extensive bilateral acute PE with CT evidence of right heart strain (RV/LV Ratio = 2.52) consistent with at least submassive (intermediate risk) PE. The presence of right heart strain has been associated with an increased risk of morbidity and mortality. Please activate Code PE by paging [phone]. 2. Mildly enlarged central pulmonary arteries, suggesting pulmonary arterial hypertension.  3. Mild calcific coronary artery and aortic atherosclerosis. 4. Mild chronic interstitial lung disease or minimal interstitial pulmonary edema. Critical Value/emergent results were called by telephone at the time of interpretation on 10/18/2018 at 8:05 pm to Dr. Loren Racer , who verbally acknowledged these results. Aortic Atherosclerosis (ICD10-I70.0). Electronically Signed   By: Beckie Salts M.D.   On: 10/18/2018 20:12   Vas [place] Lower Extremity Venous (dvt)  Result Date: 10/19/2018  Lower Venous Study Other Indications: Recent back surgery 1/28. Risk Factors: Confirmed PE. Comparison Study: No prior study on file for comparison Performing Technologist: Sherren Kerns RVS  Examination Guidelines: A complete evaluation includes B-mode imaging, spectral Doppler, color Doppler, and power Doppler as needed of all accessible portions of each vessel. Bilateral testing is considered an integral part of a complete examination. Limited examinations for reoccurring indications may be performed as noted.  Right Venous Findings: +---------+---------------+---------+-----------+----------+-------+          CompressibilityPhasicitySpontaneityPropertiesSummary +---------+---------------+---------+-----------+----------+-------+ CFV      Full           Yes      Yes                          +---------+---------------+---------+-----------+----------+-------+ SFJ      Full                                                 +---------+---------------+---------+-----------+----------+-------+ FV Prox  Full                                                 +---------+---------------+---------+-----------+----------+-------+ FV Mid   Full                                                 +---------+---------------+---------+-----------+----------+-------+ FV DistalFull                                                 +---------+---------------+---------+-----------+----------+-------+ PFV       Full                                                 +---------+---------------+---------+-----------+----------+-------+ POP      None           No       No  Acute   +---------+---------------+---------+-----------+----------+-------+ PTV      Full                                                 +---------+---------------+---------+-----------+----------+-------+ PERO     Full                                                 +---------+---------------+---------+-----------+----------+-------+ Gastroc  None                                         Acute   +---------+---------------+---------+-----------+----------+-------+  Left Venous Findings: +---------+---------------+---------+-----------+----------+-------+          CompressibilityPhasicitySpontaneityPropertiesSummary +---------+---------------+---------+-----------+----------+-------+ CFV      Full           Yes      Yes                          +---------+---------------+---------+-----------+----------+-------+ SFJ      Full                                                 +---------+---------------+---------+-----------+----------+-------+ FV Prox  Full                                                 +---------+---------------+---------+-----------+----------+-------+ FV Mid   Full                                                 +---------+---------------+---------+-----------+----------+-------+ FV DistalFull                                                 +---------+---------------+---------+-----------+----------+-------+ PFV      Full                                                 +---------+---------------+---------+-----------+----------+-------+ POP      Full           Yes      Yes                          +---------+---------------+---------+-----------+----------+-------+ PTV      Full                                                  +---------+---------------+---------+-----------+----------+-------+  PERO     Full                                                 +---------+---------------+---------+-----------+----------+-------+    Summary: Right: Findings consistent with acute deep vein thrombosis involving the right popliteal vein, and right gastrocnemius vein. Left: There is no evidence of deep vein thrombosis in the lower extremity.  *See table(s) above for measurements and observations. Electronically signed by Sherald Hesshristopher Clark MD on 10/19/2018 at 10:27:41 AM.    Final         Scheduled Meds: . atorvastatin  10 mg Oral Daily   Continuous Infusions: . heparin 1,700 Units/hr (10/19/18 0735)     LOS: 0 days     Jacquelin Hawkingalph Tahirih Lair, MD [facility] 10/19/2018, 3:23 PM  If 7PM-7AM, please contact night-coverage [email or web address]

## 2018-10-19 NOTE — Progress Notes (Signed)
  Echocardiogram 2D Echocardiogram has been performed.  Timothy Cooley 10/19/2018, 9:42 AM

## 2018-10-19 NOTE — Progress Notes (Signed)
ANTICOAGULATION CONSULT NOTE - Follow-up Consult  Pharmacy Consult for heparin Indication: pulmonary embolus  No Known Allergies  Patient Measurements: Height: 6' 1.5" (186.7 cm) Weight: 220 lb (99.8 kg) IBW/kg (Calculated) : 81.05 Heparin Dosing Weight: 100 kg  Vital Signs: Temp: 98.9 F (37.2 C) (02/08 0032) Temp Source: Oral (02/08 0032) BP: 150/88 (02/08 0032) Pulse Rate: 100 (02/08 0032)  Labs: Recent Labs    10/18/18 1743 10/18/18 2142 10/19/18 0338  HGB 14.1  --  13.0  13.3  HCT 44.3  --  40.6  39.3  PLT 222  --  219  220  HEPARINUNFRC  --   --  0.73*  CREATININE 0.91  --   --   TROPONINI 0.48* 0.59*  --     Estimated Creatinine Clearance: 104.1 mL/min (by C-G formula based on SCr of 0.91 mg/dL).  Assessment: 63 yo M on heparin for BL acute PE with R heart strain. Heparin level slightly supratherapeutic (0.73) on gtt at 1800 units/hr. CBC stable. No bleeding noted.  Goal of Therapy:  Heparin level 0.3-0.7 units/ml Monitor platelets by anticoagulation protocol: Yes   Plan:  Decrease heparin gtt to 1700 units/hr F/u 6 hr heparin level  Christoper Fabian, PharmD, BCPS Clinical pharmacist  **Pharmacist phone directory can now be found on [email or web address] ([email or web address]).  Listed under [facility]. 10/19/2018 5:09 AM

## 2018-10-19 NOTE — Progress Notes (Signed)
  NEUROSURGERY PROGRESS NOTE   Patient admitted yesterday evening after work up for shortness of breath revealed bilateral PE. He was subsequently started on heparin drip.    He is s/p C7-T1 decompression and fusion for myelopathy 10/08/18. Pre op he had essentially normal strength in BUE and BLE & gait instability. Post op was neurologically stable.  Since being on heparin drip he has grossly normal motor function in extremities. Incision is healing well, no drainage or evidence of hematoma. From a NS perspective, rec at least another 24 hours of observation with q 4 hour neuro checks.  Will consult PT/OT due to persistent gait instability.

## 2018-10-19 NOTE — Progress Notes (Signed)
VASCULAR LAB PRELIMINARY  PRELIMINARY  PRELIMINARY  PRELIMINARY  Bilateral lower extremity venous duplex completed.    Preliminary report:  See CV Proc  Called results to [place], RN  [facility], Dametra Whetsel, RVT 10/19/2018, 9:18 AM

## 2018-10-19 NOTE — Consult Note (Signed)
Chief Complaint: Patient was seen in consultation today for pulmonary embolus  Referring Physician(s): Dr. Adela Glimpse  Supervising Physician: Gilmer Mor  Patient Status: [facility] - In-pt  History of Present Illness: Timothy Cooley is a 63 y.o. male with history of HLD, HTN, and radiculopathy related to foraminal stenosis at C5-6, C6-7, and C6-T1 who underwent decompression and fusion surgery 10/08/18.  Patient now admitted to [facility] with shortness of breath.   CTA Chest 10/18/18: 1. Positive for extensive bilateral acute PE with CT evidence of right heart strain (RV/LV Ratio = 2.52) consistent with at least submassive (intermediate risk) PE. The presence of right heart strain has been associated with an increased risk of morbidity and mortality. Please activate Code PE by paging [phone]. 2. Mildly enlarged central pulmonary arteries, suggesting pulmonary arterial hypertension. 3. Mild calcific coronary artery and aortic atherosclerosis. 4. Mild chronic interstitial lung disease or minimal interstitial pulmonary edema.  IR consulted for possible lysis procedure.  Past Medical History:  Diagnosis Date  . Hyperlipidemia   . Hypertension     Past Surgical History:  Procedure Laterality Date  . APPLICATION OF ROBOTIC ASSISTANCE FOR SPINAL PROCEDURE N/A 10/08/2018   Procedure: APPLICATION OF ROBOTIC ASSISTANCE FOR SPINAL PROCEDURE;  Surgeon: Barnett Abu, MD;  Location: [facility] OR;  Service: Neurosurgery;  Laterality: N/A;  . NO PAST SURGERIES    . none    . POSTERIOR CERVICAL FUSION/FORAMINOTOMY N/A 10/08/2018   Procedure: Cervical Seven to Thoracic One laminectomy/foraminotomy with posterior fusion Cervical Seven to Thoracic One using Mazor;  Surgeon: Barnett Abu, MD;  Location: [facility] OR;  Service: Neurosurgery;  Laterality: N/A;  Cervical Seven to Thoracic One laminectomy/foraminotomy with posterior fusion Cervical Seven to Thoracic One using Mazor    Allergies: Patient has no known  allergies.  Medications: Prior to Admission medications   Medication Sig Start Date End Date Taking? Authorizing Provider  atorvastatin (LIPITOR) 10 MG tablet Take 10 mg by mouth daily.   Yes [provider]  diazepam (VALIUM) 5 MG tablet Take 1 tablet (5 mg total) by mouth every 6 (six) hours as needed for muscle spasms. 10/09/18  Yes Barnett Abu, MD  diphenhydramine-acetaminophen (TYLENOL PM) 25-500 MG TABS tablet Take 1-2 tablets by mouth at bedtime as needed (for sleep).   Yes [provider]  losartan (COZAAR) 100 MG tablet Take 100 mg by mouth daily. 08/23/18  Yes [provider]  Multiple Vitamins-Minerals (MULTIVITAMIN MEN 50+ PO) Take 1 tablet by mouth daily.    Yes [provider]  oxyCODONE-acetaminophen (PERCOCET/ROXICET) 5-325 MG tablet Take 1-2 tablets by mouth every 3 (three) hours as needed for moderate pain or severe pain. 10/09/18  Yes Barnett Abu, MD     Family History  Problem Relation Age of Onset  . Heart Problems Mother     Social History   Socioeconomic History  . Marital status: Married    Spouse name: Not on file  . Number of children: Not on file  . Years of education: Not on file  . Highest education level: Not on file  Occupational History  . Not on file  Social Needs  . Financial resource strain: Not on file  . Food insecurity:    Worry: Not on file    Inability: Not on file  . Transportation needs:    Medical: Not on file    Non-medical: Not on file  Tobacco Use  . Smoking status: Never Smoker  . Smokeless tobacco: Never Used  Substance and Sexual Activity  .  Alcohol use: Yes    Alcohol/week: 14.0 standard drinks    Types: 14 Glasses of wine per week  . Drug use: No  . Sexual activity: Yes  Lifestyle  . Physical activity:    Days per week: Not on file    Minutes per session: Not on file  . Stress: Not on file  Relationships  . Social connections:    Talks on phone: Not on file    Gets together:  Not on file    Attends religious service: Not on file    Active member of club or organization: Not on file    Attends meetings of clubs or organizations: Not on file    Relationship status: Not on file  Other Topics Concern  . Not on file  Social History Narrative  . Not on file     Review of Systems: A 12 point ROS discussed and pertinent positives are indicated in the HPI above.  All other systems are negative.  Review of Systems  Constitutional: Negative for fatigue and fever.  Respiratory: Positive for cough and shortness of breath.   Cardiovascular: Positive for chest pain.  Gastrointestinal: Negative for abdominal pain.  Musculoskeletal: Positive for back pain.  Neurological: Positive for weakness and numbness.  Psychiatric/Behavioral: Negative for behavioral problems and confusion.    Vital Signs: BP 135/84 (BP Location: Left Arm)   Pulse 86   Temp 98.1 F (36.7 C) (Oral)   Resp 13   Ht 6' 1.5" (1.867 m)   Wt 221 lb 14.4 oz (100.7 kg)   SpO2 100%   BMI 28.88 kg/m   Physical Exam Vitals signs and nursing note reviewed.  Constitutional:      General: He is not in acute distress.    Appearance: He is not ill-appearing.  Neck:     Comments: In neck brace Cardiovascular:     Rate and Rhythm: Normal rate and regular rhythm.     Pulses: Normal pulses.     Heart sounds: No murmur. No friction rub. No gallop.      Comments: DP pulses palpable, 2+ bilaterally Pulmonary:     Effort: Pulmonary effort is normal. No respiratory distress.     Breath sounds: Normal breath sounds.  Skin:    General: Skin is warm and dry.  Neurological:     Mental Status: He is alert and oriented to person, place, and time. Mental status is at baseline.  Psychiatric:        Mood and Affect: Mood normal.        Behavior: Behavior normal.        Thought Content: Thought content normal.        Judgment: Judgment normal.      MD Evaluation Airway: WNL Heart: WNL Abdomen:  WNL Chest/ Lungs: WNL ASA  Classification: 3 Mallampati/Airway Score: Two(limited due to cervical collar)   Imaging: Dg Chest 2 View  Result Date: 10/18/2018 CLINICAL DATA:  Shortness of breath on exertion. EXAM: CHEST - 2 VIEW COMPARISON:  09/06/2014. FINDINGS: Normal sized heart. Clear lungs. Cervical spine fixation hardware. Moderate left and moderate to marked right glenohumeral joint degenerative changes. IMPRESSION: No acute abnormality. Electronically Signed   By: Beckie Salts M.D.   On: 10/18/2018 18:38   Dg Cervical Spine 2-3 Views  Result Date: 10/08/2018 CLINICAL DATA:  C7-T1 laminectomy EXAM: CERVICAL SPINE - 2-3 VIEW; DG C-ARM 61-120 MIN COMPARISON:  None. FLUOROSCOPY TIME:  Fluoroscopy Time:  22 seconds Radiation Exposure Index (if provided  by the fluoroscopic device): Not available Number of Acquired Spot Images: 3 FINDINGS: Images demonstrate pedicle screws at C7 and T1 with wide operative exposure. IMPRESSION: Pedicle screws at C7-T1. Electronically Signed   By: Alcide CleverMark  Lukens M.D.   On: 10/08/2018 16:53   Ct Angio Chest Pe W And/or Wo Contrast  Result Date: 10/18/2018 CLINICAL DATA:  Shortness of breath today. EXAM: CT ANGIOGRAPHY CHEST WITH CONTRAST TECHNIQUE: Multidetector CT imaging of the chest was performed using the standard protocol during bolus administration of intravenous contrast. Multiplanar CT image reconstructions and MIPs were obtained to evaluate the vascular anatomy. CONTRAST:  75mL ISOVUE-370 IOPAMIDOL (ISOVUE-370) INJECTION 76% COMPARISON:  Chest radiographs obtained earlier today. FINDINGS: Cardiovascular: Multiple bilateral pulmonary emboli with small ones in the right and left main pulmonary arteries and larger ones in the lobar branches, including and almost occluding multiple branches. The left ventricular lumen is very small with a maximum diameter of 2.1 cm and pronounced diffuse left ventricular hypertrophy. The right ventricle is dilated with a maximum  diameter of 5.3 cm. The right ventricular to left ventricular ratio is 2.52. Mildly enlarged pulmonary arteries with the main pulmonary artery measuring 3.4 cm in transverse diameter. Calcific coronary artery and aortic atherosclerosis. Mediastinum/Nodes: No enlarged lymph nodes. The thyroid gland is not included. Unremarkable esophagus. Lungs/Pleura: Mildly prominent interstitial markings. No pleural fluid or airspace consolidation. Mild linear atelectasis or scarring in the left lower lobe. Upper Abdomen: Unremarkable. Musculoskeletal: Minimal thoracic spine degenerative changes. Lower cervical spine fixation hardware. Review of the MIP images confirms the above findings. IMPRESSION: 1. Positive for extensive bilateral acute PE with CT evidence of right heart strain (RV/LV Ratio = 2.52) consistent with at least submassive (intermediate risk) PE. The presence of right heart strain has been associated with an increased risk of morbidity and mortality. Please activate Code PE by paging [phone]. 2. Mildly enlarged central pulmonary arteries, suggesting pulmonary arterial hypertension. 3. Mild calcific coronary artery and aortic atherosclerosis. 4. Mild chronic interstitial lung disease or minimal interstitial pulmonary edema. Critical Value/emergent results were called by telephone at the time of interpretation on 10/18/2018 at 8:05 pm to Dr. Loren RacerAVID YELVERTON , who verbally acknowledged these results. Aortic Atherosclerosis (ICD10-I70.0). Electronically Signed   By: Beckie SaltsSteven  Reid M.D.   On: 10/18/2018 20:12   Ct Cervical Spine Wo Contrast  Result Date: 09/23/2018 CLINICAL DATA:  Cervical myelopathy. EXAM: CT CERVICAL SPINE WITHOUT CONTRAST TECHNIQUE: Multidetector CT imaging of the cervical spine was performed using the [facility] protocol without intravenous contrast. Multiplanar CT image reconstructions were also generated. COMPARISON:  None. FINDINGS: Alignment: 2.8 mm anterolisthesis of C7 on T1 secondary to  severe bilateral facet arthritis. Alignment is otherwise normal. Skull base and vertebrae: No acute fracture. No primary bone lesion or focal pathologic process. Soft tissues and spinal canal: No prevertebral fluid or swelling. No visible canal hematoma. Disc levels:  C2-3: Normal. C3-4: Slight left facet arthritis.  Normal disc. C4-5: Minimal left foraminal narrowing.  Otherwise negative. C5-6: Disc space narrowing. Tiny central disc bulge. Osteophytes slightly narrow both neural foramina, left more than right. C6-7: Disc space narrowing. Small endplate osteophytes asymmetric to the right create moderately severe to severe right foraminal stenosis. C7-T1: Severe bilateral facet arthritis. Narrowing of both neural foramina best seen on the sagittal images, right worse than left. No disc bulging or protrusion. 3 mm anterolisthesis of C7 on T1. Upper chest: Lung apices are normal. Moderate right facet arthritis at T2-3. Other: None IMPRESSION: Degenerative disc disease at C5-6  and C6-7 with left foraminal narrowing at C5-6, right foraminal narrowing at C6-7, and bilateral foraminal narrowing at C7-T1. Severe facet arthritis at C7-T1 bilaterally with 3 mm anterolisthesis and bilateral foraminal stenosis. Electronically Signed   By: Francene Boyers M.D.   On: 09/23/2018 12:17   Dg C-arm 1-60 Min  Result Date: 10/08/2018 CLINICAL DATA:  C7-T1 laminectomy EXAM: CERVICAL SPINE - 2-3 VIEW; DG C-ARM 61-120 MIN COMPARISON:  None. FLUOROSCOPY TIME:  Fluoroscopy Time:  22 seconds Radiation Exposure Index (if provided by the fluoroscopic device): Not available Number of Acquired Spot Images: 3 FINDINGS: Images demonstrate pedicle screws at C7 and T1 with wide operative exposure. IMPRESSION: Pedicle screws at C7-T1. Electronically Signed   By: Alcide Clever M.D.   On: 10/08/2018 16:53   Vas [place] Lower Extremity Venous (dvt)  Result Date: 10/19/2018  Lower Venous Study Other Indications: Recent back surgery 1/28. Risk Factors:  Confirmed PE. Comparison Study: No prior study on file for comparison Performing Technologist: Sherren Kerns RVS  Examination Guidelines: A complete evaluation includes B-mode imaging, spectral Doppler, color Doppler, and power Doppler as needed of all accessible portions of each vessel. Bilateral testing is considered an integral part of a complete examination. Limited examinations for reoccurring indications may be performed as noted.  Right Venous Findings: +---------+---------------+---------+-----------+----------+-------+          CompressibilityPhasicitySpontaneityPropertiesSummary +---------+---------------+---------+-----------+----------+-------+ CFV      Full           Yes      Yes                          +---------+---------------+---------+-----------+----------+-------+ SFJ      Full                                                 +---------+---------------+---------+-----------+----------+-------+ FV Prox  Full                                                 +---------+---------------+---------+-----------+----------+-------+ FV Mid   Full                                                 +---------+---------------+---------+-----------+----------+-------+ FV DistalFull                                                 +---------+---------------+---------+-----------+----------+-------+ PFV      Full                                                 +---------+---------------+---------+-----------+----------+-------+ POP      None           No       No                   Acute   +---------+---------------+---------+-----------+----------+-------+  PTV      Full                                                 +---------+---------------+---------+-----------+----------+-------+ PERO     Full                                                 +---------+---------------+---------+-----------+----------+-------+ Gastroc  None                                          Acute   +---------+---------------+---------+-----------+----------+-------+  Left Venous Findings: +---------+---------------+---------+-----------+----------+-------+          CompressibilityPhasicitySpontaneityPropertiesSummary +---------+---------------+---------+-----------+----------+-------+ CFV      Full           Yes      Yes                          +---------+---------------+---------+-----------+----------+-------+ SFJ      Full                                                 +---------+---------------+---------+-----------+----------+-------+ FV Prox  Full                                                 +---------+---------------+---------+-----------+----------+-------+ FV Mid   Full                                                 +---------+---------------+---------+-----------+----------+-------+ FV DistalFull                                                 +---------+---------------+---------+-----------+----------+-------+ PFV      Full                                                 +---------+---------------+---------+-----------+----------+-------+ POP      Full           Yes      Yes                          +---------+---------------+---------+-----------+----------+-------+ PTV      Full                                                 +---------+---------------+---------+-----------+----------+-------+  PERO     Full                                                 +---------+---------------+---------+-----------+----------+-------+    Summary: Right: Findings consistent with acute deep vein thrombosis involving the right popliteal vein, and right gastrocnemius vein. Left: There is no evidence of deep vein thrombosis in the lower extremity.  *See table(s) above for measurements and observations. Electronically signed by Sherald Hess MD on 10/19/2018 at 10:27:41 AM.    Final      Labs:  CBC: Recent Labs    10/04/18 1559 10/18/18 1743 10/19/18 0338  WBC 8.6 8.0 7.8  7.7  HGB 14.0 14.1 13.0  13.3  HCT 44.4 44.3 40.6  39.3  PLT 195 222 219  220    COAGS: No results for input(s): INR, APTT in the last 8760 hours.  BMP: Recent Labs    10/04/18 1559 10/18/18 1743 10/19/18 0338  NA 138 138 138  K 4.0 5.0 4.1  CL 104 100 103  CO2 27 25 25   GLUCOSE 108* 92 109*  BUN 17 13 10   CALCIUM 9.4 9.7 8.8*  CREATININE 0.96 0.91 0.89  GFRNONAA >60 >60 >60  GFRAA >60 >60 >60    LIVER FUNCTION TESTS: Recent Labs    10/18/18 1743 10/19/18 0338  BILITOT 1.2 0.5  AST 39 22  ALT 18 29  ALKPHOS 68 59  PROT 7.1 5.8*  ALBUMIN 3.7 3.1*    TUMOR MARKERS: No results for input(s): AFPTM, CEA, CA199, CHROMGRNA in the last 8760 hours.  Assessment and Plan: Pulmonary embolus Patient admitted with shortness of breath after recent back surgery.  Imaging reveals bilateral PE. Troponins elevated at 0.53 Currently on heparin drip. Patient assessed and reports he is feeling better, however has not been up out of bed.  Small amount of tenderness behind right calf/knee, but no tightness.  Distal pulses intact.  Discussed with Dr. Loreta Ave. Recent surgery is a contraindication for tPA infusion and given improvement recommend continuing current care plan.  No intervention planned at this time.   Thank you for this interesting consult.  I greatly enjoyed meeting Timothy Cooley and look forward to participating in their care.  A copy of this report was sent to the requesting provider on this date.  Electronically Signed: Hoyt Koch, PA 10/19/2018, 12:05 PM   I spent a total of 40 Minutes    in face to face in clinical consultation, greater than 50% of which was counseling/coordinating care for pulmonary embolus.

## 2018-10-19 NOTE — Progress Notes (Signed)
ANTICOAGULATION CONSULT NOTE - Follow-up Consult  Pharmacy Consult for heparin Indication: pulmonary embolus  No Known Allergies  Patient Measurements: Height: 6' 1.5" (186.7 cm) Weight: 221 lb 14.4 oz (100.7 kg) IBW/kg (Calculated) : 81.05 Heparin Dosing Weight: 100 kg  Vital Signs: Temp: 98.1 F (36.7 C) (02/08 0546) Temp Source: Oral (02/08 0546) BP: 135/84 (02/08 0546) Pulse Rate: 86 (02/08 0546)  Labs: Recent Labs    10/18/18 1743 10/18/18 2142 10/19/18 0338 10/19/18 1057  HGB 14.1  --  13.0  13.3  --   HCT 44.3  --  40.6  39.3  --   PLT 222  --  219  220  --   HEPARINUNFRC  --   --  0.73* 0.46  CREATININE 0.91  --  0.89  --   TROPONINI 0.48* 0.59* 0.53* 0.20*    Estimated Creatinine Clearance: 106.8 mL/min (by C-G formula based on SCr of 0.89 mg/dL).  Assessment: 63 yo M on heparin for BL acute PE with R heart strain. Noted with recent surgery and no plans for tPA. -heparin level is at goal, CBC stable  Goal of Therapy:  Heparin level 0.3-0.7 units/ml Monitor platelets by anticoagulation protocol: Yes   Plan:  -no heparin changes needed -Daily heparin level and CBC -Will follow oral anticoagulation plans  Harland German, PharmD Clinical Pharmacist **Pharmacist phone directory can now be found on [email or web address] ([email or web address]).  Listed under [facility].

## 2018-10-19 NOTE — Care Management (Signed)
$  0 co-pay for Eliquis, per [facility].

## 2018-10-20 LAB — HEPARIN LEVEL (UNFRACTIONATED): Heparin Unfractionated: 0.5 IU/mL (ref 0.30–0.70)

## 2018-10-20 LAB — CBC
HEMATOCRIT: 37.7 % — AB (ref 39.0–52.0)
HEMOGLOBIN: 12.4 g/dL — AB (ref 13.0–17.0)
MCH: 28.8 pg (ref 26.0–34.0)
MCHC: 32.9 g/dL (ref 30.0–36.0)
MCV: 87.7 fL (ref 80.0–100.0)
Platelets: 210 10*3/uL (ref 150–400)
RBC: 4.3 MIL/uL (ref 4.22–5.81)
RDW: 13.8 % (ref 11.5–15.5)
WBC: 8.2 10*3/uL (ref 4.0–10.5)
nRBC: 0 % (ref 0.0–0.2)

## 2018-10-20 MED ORDER — ELIQUIS 5 MG VTE STARTER PACK: ORAL_TABLET | ORAL | 0 refills | Status: DC

## 2018-10-20 MED ORDER — APIXABAN 5 MG PO TABS
10.0000 mg | ORAL_TABLET | Freq: Two times a day (BID) | ORAL | Status: DC
  Administered 2018-10-20: 10 mg via ORAL
  Filled 2018-10-20: qty 2

## 2018-10-20 MED ORDER — APIXABAN 5 MG PO TABS: 5.0000 mg | ORAL_TABLET | Freq: Two times a day (BID) | ORAL | Status: DC

## 2018-10-20 NOTE — Evaluation (Signed)
Physical Therapy Evaluation Patient Details Name: Timothy MaroonClaude Knoop MRN: [id number] DOB: 1955-11-22 Today's Date: 10/20/2018   History of Present Illness  Pt is a 63 y.o. male admitted 10/19/18 after work up for shortness of breath revealed bilateral PE. He is s/p C7-T1 decompression and fusion for myelopathy 10/08/18.     Clinical Impression  PT eval complete. Pt demonstrated modified independence with all mobility. RW utilized for ambulation 350 feet. Pt reports RW helps him feel more steady. Pt declined need for HHPT or OPPT. He does report feeling some core weakness, but prefers to address this at his gym. Pt advised to get clearance from neurosurgeon at his follow up appt prior to proceeding with any strength/stretching program at his gym. No further skilled PT intervention indicated. PT signing off.     Follow Up Recommendations No PT follow up;Supervision - Intermittent    Equipment Recommendations  Rolling walker with 5" wheels    Recommendations for Other Services       Precautions / Restrictions Precautions Precautions: Cervical Precaution Comments: reviewed cervical precautions Required Braces or Orthoses: Cervical Brace Cervical Brace: Hard collar      Mobility  Bed Mobility Overal bed mobility: Modified Independent                Transfers Overall transfer level: Modified independent                  Ambulation/Gait Ambulation/Gait assistance: Supervision Gait Distance (Feet): 350 Feet Assistive device: Rolling walker (2 wheeled) Gait Pattern/deviations: Step-through pattern Gait velocity: WFL Gait velocity interpretation: >2.62 ft/sec, indicative of community ambulatory General Gait Details: steady gait. SpO2 97% on RA. Max HR 96.  Stairs Stairs: (Pt declined. Reports no concerns managing stairs.)          Wheelchair Mobility    Modified Rankin (Stroke Patients Only)       Balance Overall balance assessment: Modified Independent                                            Pertinent Vitals/Pain Pain Assessment: No/denies pain    Home Living Family/patient expects to be discharged to:: Private residence Living Arrangements: Spouse/significant other Available Help at Discharge: Family Type of Home: House Home Access: Stairs to enter Entrance Stairs-Rails: Can reach both Entrance Stairs-Number of Steps: 3 plus 7 from porch Home Layout: Multi-level;Able to live on main level with bedroom/bathroom Home Equipment: None      Prior Function Level of Independence: Independent               Hand Dominance   Dominant Hand: Right    Extremity/Trunk Assessment   Upper Extremity Assessment Upper Extremity Assessment: Overall WFL for tasks assessed    Lower Extremity Assessment Lower Extremity Assessment: Overall WFL for tasks assessed    Cervical / Trunk Assessment Cervical / Trunk Assessment: Other exceptions Cervical / Trunk Exceptions: s/p surgery  Communication   Communication: No difficulties  Cognition Arousal/Alertness: Awake/alert Behavior During Therapy: WFL for tasks assessed/performed Overall Cognitive Status: Within Functional Limits for tasks assessed                                        General Comments      Exercises     Assessment/Plan  PT Assessment Patent does not need any further PT services  PT Problem List         PT Treatment Interventions      PT Goals (Current goals can be found in the Care Plan section)  Acute Rehab PT Goals Patient Stated Goal: home today PT Goal Formulation: All assessment and education complete, DC therapy    Frequency     Barriers to discharge        Co-evaluation               AM-PAC PT "6 Clicks" Mobility  Outcome Measure Help needed turning from your back to your side while in a flat bed without using bedrails?: None Help needed moving from lying on your back to sitting on the side of a  flat bed without using bedrails?: None Help needed moving to and from a bed to a chair (including a wheelchair)?: None Help needed standing up from a chair using your arms (e.g., wheelchair or bedside chair)?: None Help needed to walk in hospital room?: None Help needed climbing 3-5 steps with a railing? : None 6 Click Score: 24    End of Session Equipment Utilized During Treatment: Gait belt;Cervical collar Activity Tolerance: Patient tolerated treatment well Patient left: in bed;with call bell/phone within reach Nurse Communication: Mobility status PT Visit Diagnosis: Difficulty in walking, not elsewhere classified (R26.2)    Time: 9629-52841052-1102 PT Time Calculation (min) (ACUTE ONLY): 10 min   Charges:   PT Evaluation $PT Eval Low Complexity: 1 Low          Aida RaiderWendy Miesha Bachmann, PT  Office # [phone] Pager [phone]   Ilda FoilGarrow, Duwayne Matters Rene 10/20/2018, 12:04 PM

## 2018-10-20 NOTE — Plan of Care (Signed)

## 2018-10-20 NOTE — Discharge Summary (Signed)
Physician Discharge Summary  Timothy Cooley OLM:[id number] DOB: [birth year] DOA: 10/18/2018  PCP: Timothy Nephew, MD  Admit date: 10/18/2018 Discharge date: 10/20/2018  Admitted From: Home Disposition: Home  Recommendations for Outpatient Follow-up:  1. Follow up with PCP in 1 week 2. Cardiology follow-up for mild heart strain 3. Please follow up on the following pending results: None  Home Health: None Equipment/Devices: Rolling walker  Discharge Condition: Stable CODE STATUS: Full code Diet recommendation: Regular diet   Brief/Interim Summary:  Admission HPI written by Timothy Doyne, MD   HPI: Timothy Cooley is a 63 y.o. male with medical history significant of HLD, HTN cervical radiculopathy which appeared to be caused by some foraminal stenosis at C5-6, C6-7 and C7-T1.    Presented  after surgery on 1/28 (c7-t1 decomp.) with acute onset sob while he got up to go for a walk at 3PM No chest pain Some back pain at the site of the recent operation Leg swelling right worse than left Pt  Traveled to [place] by plain flight less than 2h on Jan  No hx of Clot   Hospital course:  Acute saddle PE Acute right LE DVTs In setting of recent surgery and decreased mobility. Heart strain seen on CTA. Started on heparin drip. Not a candidate for thrombolysis per interventional radiology assessment. Weaned to room air. Transthoracic Echocardiogram with evidence of right heart strain. Transitioned to Eliquis. Follow-up with PCP and cardiology for heart strain.  Recent back surgery Neurosurgery follow-up as an outpatient  Elevated troponin In setting of right heart strain from PE. Trending down.  Hyperlipidemia Continue Lipitor  Essential hypertension Slightly hypertensive.  Chronic diastolic heart failure Euvolemic. Not on outpatient medication regimen  Anxiety Continue diazepam prn  Discharge Diagnoses:  Active Problems:   Chronic diastolic heart failure  (HCC)   HTN (hypertension)   Hyperlipidemia   PE (pulmonary thromboembolism) (HCC)   Elevated troponin    Discharge Instructions  Discharge Instructions    Call MD for:  difficulty breathing, headache or visual disturbances   Complete by:  As directed    Call MD for:  severe uncontrolled pain   Complete by:  As directed    Increase activity slowly   Complete by:  As directed      Allergies as of 10/20/2018   No Known Allergies     Medication List    TAKE these medications   atorvastatin 10 MG tablet Commonly known as:  LIPITOR Take 10 mg by mouth daily.   diazepam 5 MG tablet Commonly known as:  VALIUM Take 1 tablet (5 mg total) by mouth every 6 (six) hours as needed for muscle spasms.   diphenhydramine-acetaminophen 25-500 MG Tabs tablet Commonly known as:  TYLENOL PM Take 1-2 tablets by mouth at bedtime as needed (for sleep).   ELIQUIS STARTER PACK 5 MG Tabs Take as directed on package: start with two-5mg  tablets twice daily for 7 days. On day 8, switch to one-5mg  tablet twice daily.   losartan 100 MG tablet Commonly known as:  COZAAR Take 100 mg by mouth daily.   MULTIVITAMIN MEN 50+ PO Take 1 tablet by mouth daily.   oxyCODONE-acetaminophen 5-325 MG tablet Commonly known as:  PERCOCET/ROXICET Take 1-2 tablets by mouth every 3 (three) hours as needed for moderate pain or severe pain.            Durable Medical Equipment  (From admission, onward)         Start  Ordered   10/20/18 1252  For home use only DME Walker rolling  Once    Question:  Patient needs a walker to treat with the following condition  Answer:  Cervical radiculopathy   10/20/18 1251         Follow-up Information    Timothy Nephew, MD. Schedule an appointment as soon as possible for a visit in 1 week(s).   Specialty:  Internal Medicine Contact information: [address] [place] [address] [phone]        [facility] Follow up.   Why:  Right heart strain Contact information: [address] [place] [address] [phone]         No Known Allergies  Consultations:  Interventional radiology   Procedures/Studies: Dg Chest 2 View  Result Date: 10/18/2018 CLINICAL DATA:  Shortness of breath on exertion. EXAM: CHEST - 2 VIEW COMPARISON:  09/06/2014. FINDINGS: Normal sized heart. Clear lungs. Cervical spine fixation hardware. Moderate left and moderate to marked right glenohumeral joint degenerative changes. IMPRESSION: No acute abnormality. Electronically Signed   By: Beckie Salts M.D.   On: 10/18/2018 18:38   Dg Cervical Spine 2-3 Views  Result Date: 10/08/2018 CLINICAL DATA:  C7-T1 laminectomy EXAM: CERVICAL SPINE - 2-3 VIEW; DG C-ARM 61-120 MIN COMPARISON:  None. FLUOROSCOPY TIME:  Fluoroscopy Time:  22 seconds Radiation Exposure Index (if provided by the fluoroscopic device): Not available Number of Acquired Spot Images: 3 FINDINGS: Images demonstrate pedicle screws at C7 and T1 with wide operative exposure. IMPRESSION: Pedicle screws at C7-T1. Electronically Signed   By: Alcide Clever M.D.   On: 10/08/2018 16:53   Ct Angio Chest Pe W And/or Wo Contrast  Result Date: 10/18/2018 CLINICAL DATA:  Shortness of breath today. EXAM: CT ANGIOGRAPHY CHEST WITH CONTRAST TECHNIQUE: Multidetector CT imaging of the chest was performed using the standard protocol during bolus administration of intravenous contrast. Multiplanar CT image reconstructions and MIPs were obtained to evaluate the vascular anatomy. CONTRAST:  75mL ISOVUE-370 IOPAMIDOL (ISOVUE-370) INJECTION 76% COMPARISON:  Chest radiographs obtained earlier today. FINDINGS: Cardiovascular: Multiple bilateral pulmonary emboli with small ones in the right and left main pulmonary arteries and larger ones in the lobar branches, including and almost occluding multiple branches. The left ventricular lumen is very  small with a maximum diameter of 2.1 cm and pronounced diffuse left ventricular hypertrophy. The right ventricle is dilated with a maximum diameter of 5.3 cm. The right ventricular to left ventricular ratio is 2.52. Mildly enlarged pulmonary arteries with the main pulmonary artery measuring 3.4 cm in transverse diameter. Calcific coronary artery and aortic atherosclerosis. Mediastinum/Nodes: No enlarged lymph nodes. The thyroid gland is not included. Unremarkable esophagus. Lungs/Pleura: Mildly prominent interstitial markings. No pleural fluid or airspace consolidation. Mild linear atelectasis or scarring in the left lower lobe. Upper Abdomen: Unremarkable. Musculoskeletal: Minimal thoracic spine degenerative changes. Lower cervical spine fixation hardware. Review of the MIP images confirms the above findings. IMPRESSION: 1. Positive for extensive bilateral acute PE with CT evidence of right heart strain (RV/LV Ratio = 2.52) consistent with at least submassive (intermediate risk) PE. The presence of right heart strain has been associated with an increased risk of morbidity and mortality. Please activate Code PE by paging [phone]. 2. Mildly enlarged central pulmonary arteries, suggesting pulmonary arterial hypertension. 3. Mild calcific coronary artery and aortic atherosclerosis. 4. Mild chronic interstitial lung disease or minimal interstitial pulmonary edema. Critical Value/emergent results were called by telephone  at the time of interpretation on 10/18/2018 at 8:05 pm to Dr. Loren Racer , who verbally acknowledged these results. Aortic Atherosclerosis (ICD10-I70.0). Electronically Signed   By: Beckie Salts M.D.   On: 10/18/2018 20:12   Ct Cervical Spine Wo Contrast  Result Date: 09/23/2018 CLINICAL DATA:  Cervical myelopathy. EXAM: CT CERVICAL SPINE WITHOUT CONTRAST TECHNIQUE: Multidetector CT imaging of the cervical spine was performed using the [facility] protocol without intravenous contrast. Multiplanar  CT image reconstructions were also generated. COMPARISON:  None. FINDINGS: Alignment: 2.8 mm anterolisthesis of C7 on T1 secondary to severe bilateral facet arthritis. Alignment is otherwise normal. Skull base and vertebrae: No acute fracture. No primary bone lesion or focal pathologic process. Soft tissues and spinal canal: No prevertebral fluid or swelling. No visible canal hematoma. Disc levels:  C2-3: Normal. C3-4: Slight left facet arthritis.  Normal disc. C4-5: Minimal left foraminal narrowing.  Otherwise negative. C5-6: Disc space narrowing. Tiny central disc bulge. Osteophytes slightly narrow both neural foramina, left more than right. C6-7: Disc space narrowing. Small endplate osteophytes asymmetric to the right create moderately severe to severe right foraminal stenosis. C7-T1: Severe bilateral facet arthritis. Narrowing of both neural foramina best seen on the sagittal images, right worse than left. No disc bulging or protrusion. 3 mm anterolisthesis of C7 on T1. Upper chest: Lung apices are normal. Moderate right facet arthritis at T2-3. Other: None IMPRESSION: Degenerative disc disease at C5-6 and C6-7 with left foraminal narrowing at C5-6, right foraminal narrowing at C6-7, and bilateral foraminal narrowing at C7-T1. Severe facet arthritis at C7-T1 bilaterally with 3 mm anterolisthesis and bilateral foraminal stenosis. Electronically Signed   By: Francene Boyers M.D.   On: 09/23/2018 12:17   Dg C-arm 1-60 Min  Result Date: 10/08/2018 CLINICAL DATA:  C7-T1 laminectomy EXAM: CERVICAL SPINE - 2-3 VIEW; DG C-ARM 61-120 MIN COMPARISON:  None. FLUOROSCOPY TIME:  Fluoroscopy Time:  22 seconds Radiation Exposure Index (if provided by the fluoroscopic device): Not available Number of Acquired Spot Images: 3 FINDINGS: Images demonstrate pedicle screws at C7 and T1 with wide operative exposure. IMPRESSION: Pedicle screws at C7-T1. Electronically Signed   By: Alcide Clever M.D.   On: 10/08/2018 16:53   Vas [place]  Lower Extremity Venous (dvt)  Result Date: 10/19/2018  Lower Venous Study Other Indications: Recent back surgery 1/28. Risk Factors: Confirmed PE. Comparison Study: No prior study on file for comparison Performing Technologist: Sherren Kerns RVS  Examination Guidelines: A complete evaluation includes B-mode imaging, spectral Doppler, color Doppler, and power Doppler as needed of all accessible portions of each vessel. Bilateral testing is considered an integral part of a complete examination. Limited examinations for reoccurring indications may be performed as noted.  Right Venous Findings: +---------+---------------+---------+-----------+----------+-------+          CompressibilityPhasicitySpontaneityPropertiesSummary +---------+---------------+---------+-----------+----------+-------+ CFV      Full           Yes      Yes                          +---------+---------------+---------+-----------+----------+-------+ SFJ      Full                                                 +---------+---------------+---------+-----------+----------+-------+ FV Prox  Full                                                 +---------+---------------+---------+-----------+----------+-------+  FV Mid   Full                                                 +---------+---------------+---------+-----------+----------+-------+ FV DistalFull                                                 +---------+---------------+---------+-----------+----------+-------+ PFV      Full                                                 +---------+---------------+---------+-----------+----------+-------+ POP      None           No       No                   Acute   +---------+---------------+---------+-----------+----------+-------+ PTV      Full                                                 +---------+---------------+---------+-----------+----------+-------+ PERO     Full                                                  +---------+---------------+---------+-----------+----------+-------+ Gastroc  None                                         Acute   +---------+---------------+---------+-----------+----------+-------+  Left Venous Findings: +---------+---------------+---------+-----------+----------+-------+          CompressibilityPhasicitySpontaneityPropertiesSummary +---------+---------------+---------+-----------+----------+-------+ CFV      Full           Yes      Yes                          +---------+---------------+---------+-----------+----------+-------+ SFJ      Full                                                 +---------+---------------+---------+-----------+----------+-------+ FV Prox  Full                                                 +---------+---------------+---------+-----------+----------+-------+ FV Mid   Full                                                 +---------+---------------+---------+-----------+----------+-------+ FV DistalFull                                                 +---------+---------------+---------+-----------+----------+-------+  PFV      Full                                                 +---------+---------------+---------+-----------+----------+-------+ POP      Full           Yes      Yes                          +---------+---------------+---------+-----------+----------+-------+ PTV      Full                                                 +---------+---------------+---------+-----------+----------+-------+ PERO     Full                                                 +---------+---------------+---------+-----------+----------+-------+    Summary: Right: Findings consistent with acute deep vein thrombosis involving the right popliteal vein, and right gastrocnemius vein. Left: There is no evidence of deep vein thrombosis in the lower extremity.  *See table(s) above for  measurements and observations. Electronically signed by Sherald Hesshristopher Clark MD on 10/19/2018 at 10:27:41 AM.    Final     Transthoracic Echocardiogram (10/19/2018) IMPRESSIONS    1. Technically challenging exam with suboptimal image quality despite the use of Definity IV contrast.  2. The left ventricle has hyperdynamic systolic function of >65%. The cavity size was normal. There is no increased left ventricular wall thickness. Left ventricular diastology could not be evaluated due to indeterminent diastolic function.  3. The right ventricle was not well visualized.  4. The right ventricle has mildly reduced systolic function. The cavity was mildly enlarged. There is no increase in right ventricular wall thickness.  5. RVSP:25.8 mmHg.  6. The tricuspid valve is normal in structure.  7. The inferior vena cava is dilated in size with greater than 50% respiratory variability. This suggests a RA pressure estimate of 8 mmHg.  8. The mitral valve is normal in structure. There is mild calcification.   Subjective: No chest pain, shortness of breath or leg pain.  Discharge Exam: Vitals:   10/20/18 0000 10/20/18 0553  BP:  112/76  Pulse: 80 75  Resp:  16  Temp:  98.4 F (36.9 C)  SpO2: 97% 97%   Vitals:   10/19/18 2045 10/19/18 2333 10/20/18 0000 10/20/18 0553  BP: 131/81 128/77  112/76  Pulse: 83 79 80 75  Resp: 20   16  Temp: 98.1 F (36.7 C) 97.7 F (36.5 C)  98.4 F (36.9 C)  TempSrc: Oral Oral  Oral  SpO2: 98%  97% 97%  Weight:    99.7 kg  Height:        General: Pt is alert, awake, not in acute distress Cardiovascular: RRR, S1/S2 +, no rubs, no gallops Respiratory: CTA bilaterally, no wheezing, no rhonchi Abdominal: Soft, NT, ND, bowel sounds + Extremities: Right leg with calf swelling    The results of significant diagnostics from this hospitalization (including imaging, microbiology, ancillary and laboratory) are listed below for reference.  Microbiology: No  results found for this or any previous visit (from the past 240 hour(s)).   Labs: BNP (last 3 results) Recent Labs    10/18/18 1743  BNP 14.1   Basic Metabolic Panel: Recent Labs  Lab 10/18/18 1743 10/19/18 0338  NA 138 138  K 5.0 4.1  CL 100 103  CO2 25 25  GLUCOSE 92 109*  BUN 13 10  CREATININE 0.91 0.89  CALCIUM 9.7 8.8*  MG  --  2.1  PHOS  --  3.8   Liver Function Tests: Recent Labs  Lab 10/18/18 1743 10/19/18 0338  AST 39 22  ALT 18 29  ALKPHOS 68 59  BILITOT 1.2 0.5  PROT 7.1 5.8*  ALBUMIN 3.7 3.1*   No results for input(s): LIPASE, AMYLASE in the last 168 hours. No results for input(s): AMMONIA in the last 168 hours. CBC: Recent Labs  Lab 10/18/18 1743 10/19/18 0338 10/20/18 0339  WBC 8.0 7.8  7.7 8.2  NEUTROABS 5.2  --   --   HGB 14.1 13.0  13.3 12.4*  HCT 44.3 40.6  39.3 37.7*  MCV 87.9 86.4  85.8 87.7  PLT 222 219  220 210   Cardiac Enzymes: Recent Labs  Lab 10/18/18 1743 10/18/18 2142 10/19/18 0338 10/19/18 1057  TROPONINI 0.48* 0.59* 0.53* 0.20*   BNP: Invalid input(s): POCBNP CBG: No results for input(s): GLUCAP in the last 168 hours. D-Dimer No results for input(s): DDIMER in the last 72 hours. Hgb A1c No results for input(s): HGBA1C in the last 72 hours. Lipid Profile No results for input(s): CHOL, HDL, LDLCALC, TRIG, CHOLHDL, LDLDIRECT in the last 72 hours. Thyroid function studies Recent Labs    10/19/18 0338  TSH 2.366   Anemia work up No results for input(s): VITAMINB12, FOLATE, FERRITIN, TIBC, IRON, RETICCTPCT in the last 72 hours. Urinalysis    Component Value Date/Time   COLORURINE YELLOW 09/06/2014 1759   APPEARANCEUR CLEAR 09/06/2014 1759   LABSPEC >1.046 (H) 09/06/2014 1759   PHURINE 7.0 09/06/2014 1759   GLUCOSEU NEGATIVE 09/06/2014 1759   HGBUR NEGATIVE 09/06/2014 1759   BILIRUBINUR NEGATIVE 09/06/2014 1759   KETONESUR NEGATIVE 09/06/2014 1759   PROTEINUR NEGATIVE 09/06/2014 1759   UROBILINOGEN  0.2 09/06/2014 1759   NITRITE NEGATIVE 09/06/2014 1759   LEUKOCYTESUR NEGATIVE 09/06/2014 1759    SIGNED:   Jacquelin Hawking, MD [facility] 10/20/2018, 1:06 PM

## 2018-10-20 NOTE — Progress Notes (Signed)
  NEUROSURGERY PROGRESS NOTE   No issues overnight. Denies weakness in extremities  EXAM:  BP 112/76 (BP Location: Left Arm)   Pulse 75   Temp 98.4 F (36.9 C) (Oral)   Resp 16   Ht 6' 1.5" (1.867 m)   Wt 99.7 kg   SpO2 97%   BMI 28.61 kg/m   Awake, alert, oriented  Speech fluent, appropriate  CN grossly intact  5/5 BUE/BLE  Incision: c/d/i, flat no swelling or palpable hematoma  IPLAN Stable neurologically Cleared for discharge from [facility] perspective Keep appt with Dr Danielle Dess Wednesday

## 2018-10-20 NOTE — Discharge Instructions (Addendum)
Timothy Cooley,  You were in the hospital because of a pulmonary embolism and deep vein thrombosis (DVT). You have been placed on blood thinners to treat these blood clots. Please follow-up with your primary care physician.  Information on my medicine - ELIQUIS (apixaban)  This medication education was reviewed with me or my healthcare representative as part of my discharge preparation.    Why was Eliquis prescribed for you? Eliquis was prescribed to treat blood clots that may have been found in the veins of your legs (deep vein thrombosis) or in your lungs (pulmonary embolism) and to reduce the risk of them occurring again.  What do You need to know about Eliquis ? The starting dose is 10 mg (two 5 mg tablets) taken TWICE daily for the FIRST SEVEN (7) DAYS, then on February 16th, 2020  the dose is reduced to ONE 5 mg tablet taken TWICE daily.  Eliquis may be taken with or without food.   Try to take the dose about the same time in the morning and in the evening. If you have difficulty swallowing the tablet whole please discuss with your pharmacist how to take the medication safely.  Take Eliquis exactly as prescribed and DO NOT stop taking Eliquis without talking to the doctor who prescribed the medication.  Stopping may increase your risk of developing a new blood clot.  Refill your prescription before you run out.  After discharge, you should have regular check-up appointments with your healthcare provider that is prescribing your Eliquis.    What do you do if you miss a dose? If a dose of ELIQUIS is not taken at the scheduled time, take it as soon as possible on the same day and twice-daily administration should be resumed. The dose should not be doubled to make up for a missed dose.  Important Safety Information A possible side effect of Eliquis is bleeding. You should call your healthcare provider right away if you experience any of the following: ? Bleeding from an injury or  your nose that does not stop. ? Unusual colored urine (red or dark brown) or unusual colored stools (red or black). ? Unusual bruising for unknown reasons. ? A serious fall or if you hit your head (even if there is no bleeding).  Some medicines may interact with Eliquis and might increase your risk of bleeding or clotting while on Eliquis. To help avoid this, consult your healthcare provider or pharmacist prior to using any new prescription or non-prescription medications, including herbals, vitamins, non-steroidal anti-inflammatory drugs (NSAIDs) and supplements.  This website has more information on Eliquis (apixaban): http://www.eliquis.com/eliquis/home

## 2018-10-20 NOTE — Progress Notes (Signed)
ANTICOAGULATION CONSULT NOTE - Follow-up Consult  Pharmacy Consult for Heparin  >> Apixaban Indication: pulmonary embolus and DVT   No Known Allergies  Patient Measurements: Height: 6' 1.5" (186.7 cm) Weight: 219 lb 12.8 oz (99.7 kg) IBW/kg (Calculated) : 81.05 Heparin Dosing Weight: 100 kg  Vital Signs: Temp: 98.4 F (36.9 C) (02/09 0553) Temp Source: Oral (02/09 0553) BP: 112/76 (02/09 0553) Pulse Rate: 75 (02/09 0553)  Labs: Recent Labs    10/18/18 1743 10/18/18 2142 10/19/18 0338 10/19/18 1057 10/20/18 0339  HGB 14.1  --  13.0  13.3  --  12.4*  HCT 44.3  --  40.6  39.3  --  37.7*  PLT 222  --  219  220  --  210  HEPARINUNFRC  --   --  0.73* 0.46 0.50  CREATININE 0.91  --  0.89  --   --   TROPONINI 0.48* 0.59* 0.53* 0.20*  --     Estimated Creatinine Clearance: 106.3 mL/min (by C-G formula based on SCr of 0.89 mg/dL).  Assessment: 63 yo M who presented on 2/7 with SOB and was found to have an acute saddle PE and RLE DVT thought to be provoked from recent surgery and immobility. The patient was initially started on Heparin however plans are now to transition to Apixaban. Pharmacy consulted to dose.   Goal of Therapy:  Heparin level 0.3-0.7 units/ml Monitor platelets by anticoagulation protocol: Yes   Plan:  - Discontinue Heparin drip at 0959 today - Start Apixaban 10 mg bid x 7 days (thru 2/15) followed by 5 mg bid (starting 2/16) - The patient and wife were educated today - Will monitor for any signs/symptoms of bleeding or necessary dose adjustments  Thank you for allowing pharmacy to be a part of this patient's care.  Georgina Pillion, PharmD, BCPS Clinical Pharmacist Clinical phone for 10/20/2018: [phone] 10/20/2018 8:56 AM   **Pharmacist phone directory can now be found on [email or web address] ([email or web address]).  Listed under [facility].

## 2018-11-08 ENCOUNTER — Encounter (HOSPITAL_COMMUNITY): Payer: Self-pay | Admitting: Neurological Surgery

## 2019-02-10 ENCOUNTER — Telehealth: Payer: Self-pay | Admitting: Nurse Practitioner

## 2019-02-10 NOTE — Telephone Encounter (Signed)
Sent patient link for MyChart sign-up. He agrees to virtual visit and will try to have vital signs and weight available on the morning of the visit.   YOUR CARDIOLOGY TEAM HAS ARRANGED FOR AN E-VISIT FOR YOUR APPOINTMENT - PLEASE REVIEW IMPORTANT INFORMATION BELOW SEVERAL DAYS PRIOR TO YOUR APPOINTMENT  Due to the recent COVID-19 pandemic, we are transitioning in-person office visits to tele-medicine visits in an effort to decrease unnecessary exposure to our patients, their families, and staff. These visits are billed to your insurance just like a normal visit is. We also encourage you to sign up for MyChart if you have not already done so. You will need a smartphone if possible. For patients that do not have this, we can still complete the visit using a regular telephone but do prefer a smartphone to enable video when possible. You may have a family member that lives with you that can help. If possible, we also ask that you have a blood pressure cuff and scale at home to measure your blood pressure, heart rate and weight prior to your scheduled appointment. Patients with clinical needs that need an in-person evaluation and testing will still be able to come to the office if absolutely necessary. If you have any questions, feel free to call our office.    YOUR PROVIDER WILL BE USING THE FOLLOWING PLATFORM TO COMPLETE YOUR VISIT: Doxy.Me   . IF USING DOXIMITY or DOXY.ME - The staff will give you instructions on receiving your link to join the meeting the day of your visit.     2-3 DAYS BEFORE YOUR APPOINTMENT  You will receive a telephone call from one of our [facility] team members - your caller ID may say "Unknown caller." If this is a video visit, we will walk you through how to get the video launched on your phone. We will remind you check your blood pressure, heart rate and weight prior to your scheduled appointment. If you have an Apple Watch or Kardia, please upload any pertinent ECG strips  the day before or morning of your appointment to MyChart. Our staff will also make sure you have reviewed the consent and agree to move forward with your scheduled tele-health visit.     THE DAY OF YOUR APPOINTMENT  Approximately 15 minutes prior to your scheduled appointment, you will receive a telephone call from one of HeartCare team - your caller ID may say "Unknown caller."  Our staff will confirm medications, vital signs for the day and any symptoms you may be experiencing. Please have this information available prior to the time of visit start. It may also be helpful for you to have a pad of paper and pen handy for any instructions given during your visit. They will also walk you through joining the smartphone meeting if this is a video visit.    CONSENT FOR TELE-HEALTH VISIT - PLEASE REVIEW  I hereby voluntarily request, consent and authorize [facility] and its employed or contracted physicians, physician assistants, nurse practitioners or other licensed health care professionals (the Practitioner), to provide me with telemedicine health care services (the "Services") as deemed necessary by the treating Practitioner. I acknowledge and consent to receive the Services by the Practitioner via telemedicine. I understand that the telemedicine visit will involve communicating with the Practitioner through live audiovisual communication technology and the disclosure of certain medical information by electronic transmission. I acknowledge that I have been given the opportunity to request an in-person assessment or other available alternative prior  to the telemedicine visit and am voluntarily participating in the telemedicine visit.  I understand that I have the right to withhold or withdraw my consent to the use of telemedicine in the course of my care at any time, without affecting my right to future care or treatment, and that the Practitioner or I may terminate the telemedicine visit at any  time. I understand that I have the right to inspect all information obtained and/or recorded in the course of the telemedicine visit and may receive copies of available information for a reasonable fee.  I understand that some of the potential risks of receiving the Services via telemedicine include:  Marland Kitchen Delay or interruption in medical evaluation due to technological equipment failure or disruption; . Information transmitted may not be sufficient (e.g. poor resolution of images) to allow for appropriate medical decision making by the Practitioner; and/or  . In rare instances, security protocols could fail, causing a breach of personal health information.  Furthermore, I acknowledge that it is my responsibility to provide information about my medical history, conditions and care that is complete and accurate to the best of my ability. I acknowledge that Practitioner's advice, recommendations, and/or decision may be based on factors not within their control, such as incomplete or inaccurate data provided by me or distortions of diagnostic images or specimens that may result from electronic transmissions. I understand that the practice of medicine is not an exact science and that Practitioner makes no warranties or guarantees regarding treatment outcomes. I acknowledge that I will receive a copy of this consent concurrently upon execution via email to the email address I last provided but may also request a printed copy by calling the office of [facility].    I understand that my insurance will be billed for this visit.   I have read or had this consent read to me. . I understand the contents of this consent, which adequately explains the benefits and risks of the Services being provided via telemedicine.  . I have been provided ample opportunity to ask questions regarding this consent and the Services and have had my questions answered to my satisfaction. . I give my informed consent for the services  to be provided through the use of telemedicine in my medical care  By participating in this telemedicine visit I agree to the above.

## 2019-02-16 NOTE — Progress Notes (Signed)
Virtual Visit via Video Note   This visit type was conducted due to national recommendations for restrictions regarding the COVID-19 Pandemic (e.g. social distancing) in an effort to limit this patient's exposure and mitigate transmission in our community.  Due to his co-morbid illnesses, this patient is at least at moderate risk for complications without adequate follow up.  This format is felt to be most appropriate for this patient at this time.  All issues noted in this document were discussed and addressed.  A limited physical exam was performed with this format.  Please refer to the patient's chart for his consent to telehealth for [facility].   Date:  02/18/2019   ID:  Timothy Cooley, DOB 06/19/1956, MRN [id number]  Patient Location: Home Provider Location: Home  PCP:  Levin Erp, MD  Cardiologist:    Electrophysiologist:  None   Evaluation Performed:  New Patient Evaluation  Problem List 1. PUlmonary Embolus 2. Chronic diastolic CHF 3.  HTN 4.  hyperlipidemia   Chief Complaint:  Follow up pulmonary embolus   February 18, 2019   Timothy Cooley is a 63 y.o. male who I saw in Jan. 2016 .  He was recently hospitalized with a pulmonary embolus.   CTA of the lungs revealed a saddle pulmonary embolus with evidence of Right heart strain.  We were asked to see him By Dr. Teryl Lucy  for follow up of this R heart strain during a pulmonary embolus.   The patient had neck surgery on Jan. 28, 2020. Presented with an acute saddle pulmonary embolus on Feb. 7,  Was seen by interventional radiology .   Was not a candidate for thombolysis due to his recent neck surgery   Echocardiogram reveals hyperdynamic left ventricular systolic function with an ejection function of greater than 65%.  The right ventricle was not well well visualized.  It did appear to have mildly reduced systolic function with mild right ventricular enlargement.  Troponin levels were minimally elevated.  Has some  residual neck issues that causes some weakness below his waist.   Gait is unsteady .   Breathing is good.   Rode 20 miles this past weekend. No CP or dyspnea.     The patient does not have symptoms concerning for COVID-19 infection (fever, chills, cough, or new shortness of breath).    Past Medical History:  Diagnosis Date  . Hyperlipidemia   . Hypertension    Past Surgical History:  Procedure Laterality Date  . APPLICATION OF ROBOTIC ASSISTANCE FOR SPINAL PROCEDURE N/A 10/08/2018   Procedure: APPLICATION OF ROBOTIC ASSISTANCE FOR SPINAL PROCEDURE;  Surgeon: Kristeen Miss, MD;  Location: [facility];  Service: Neurosurgery;  Laterality: N/A;  . NO PAST SURGERIES    . none    . POSTERIOR CERVICAL FUSION/FORAMINOTOMY N/A 10/08/2018   Procedure: Cervical Seven to Thoracic One laminectomy/foraminotomy with posterior fusion Cervical Seven to Thoracic One using Mazor;  Surgeon: Kristeen Miss, MD;  Location: [facility];  Service: Neurosurgery;  Laterality: N/A;  Cervical Seven to Thoracic One laminectomy/foraminotomy with posterior fusion Cervical Seven to Thoracic One using Mazor     Current Meds  Medication Sig  . apixaban (ELIQUIS) 5 MG TABS tablet Take 5 mg by mouth 2 (two) times daily.  Marland Kitchen atorvastatin (LIPITOR) 10 MG tablet Take 10 mg by mouth daily.  . diphenhydramine-acetaminophen (TYLENOL PM) 25-500 MG TABS tablet Take 1-2 tablets by mouth at bedtime as needed (for sleep).  . losartan (COZAAR) 100 MG tablet Take 100 mg by  mouth daily.  . Multiple Vitamins-Minerals (MULTIVITAMIN MEN 50+ PO) Take 1 tablet by mouth daily.      Allergies:   Patient has no known allergies.   Social History   Tobacco Use  . Smoking status: Never Smoker  . Smokeless tobacco: Never Used  Substance Use Topics  . Alcohol use: Yes    Alcohol/week: 14.0 standard drinks    Types: 14 Glasses of wine per week  . Drug use: No     Family Hx: The patient's family history includes Heart Problems in his mother.   ROS:   Please see the history of present illness.     All other systems reviewed and are negative.   Prior CV studies:   The following studies were reviewed today:    Labs/Other Tests and Data Reviewed:    EKG:  No ECG reviewed.  Recent Labs: 10/18/2018: B Natriuretic Peptide 14.1 10/19/2018: ALT 29; BUN 10; Creatinine, Ser 0.89; Magnesium 2.1; Potassium 4.1; Sodium 138; TSH 2.366 10/20/2018: Hemoglobin 12.4; Platelets 210   Recent Lipid Panel Lab Results  Component Value Date/Time   CHOL 179 09/07/2014 01:09 AM   TRIG 96 09/07/2014 01:09 AM   HDL 49 09/07/2014 01:09 AM   CHOLHDL 3.7 09/07/2014 01:09 AM   LDLCALC 111 (H) 09/07/2014 01:09 AM    Wt Readings from Last 3 Encounters:  02/18/19 227 lb (103 kg)  10/20/18 219 lb 12.8 oz (99.7 kg)  10/08/18 220 lb (99.8 kg)     Objective:    Vital Signs:  Ht 6' 1.5" (1.867 m)   Wt 227 lb (103 kg)   BMI 29.54 kg/m    General:   Appears healthy,   NAD HEENT:   No obvious JVD or lymphadenopathy Resp:   Normal work of breathing,   resp rate is normal  CV :   BP and HR are normal ,  No edema Abd:   No abdomina swelling , Ext:   No clubbing, cyanosis, or edema  Neuro:   Alert and oriented x 3.   No obvious motor deficits Skin : no obvious rashes    ASSESSMENT & PLAN:    1. Acute pulmonary embolus:   Clot presented with a saddle pulmonary embolus shortly after he had neck surgery.  He was not a candidate for TPA because of his recent neck surgery.  He was started on heparin and Eliquis.  He is done very well.  From a cardiac standpoint he seems to be doing great.  He has not been limited by chest pain or shortness of breath.  He rode his mountain bike 20 miles this past weekend.  I think he should take the Eliquis for a total of 6 months after his dx for this provoked PE .     He  is scheduled to travel out of the country in late August.  He and his wife are wondering whether he should take Eliquis when he travels such long  distances.     I will see him in 4 months.  We will plan on doing a repeat echocardiogram to look at his RV function and size.  I discussed with Dr. Delton CoombesByrum about whether or not he needs to take prophylactic Eliquis before traveling to [place] or further.  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  I do think that he needs to take SBE prophylaxis before and after any future surgeries since he has had a pulmonary embolus.  COVID-19  Education: The signs and symptoms of COVID-19 were discussed with the patient and how to seek care for testing (follow up with PCP or arrange E-visit).  The importance of social distancing was discussed today.  Time:   Today, I have spent  41  minutes with the patient with telehealth technology discussing the above problems.     Medication Adjustments/Labs and Tests Ordered: Current medicines are reviewed at length with the patient today.  Concerns regarding medicines are outlined above.   Tests Ordered: Orders Placed This Encounter  Procedures  . ECHOCARDIOGRAM COMPLETE    Medication Changes: No orders of the defined types were placed in this encounter.   Disposition:  Follow up in 4 month(s)  Signed, Kristeen MissPhilip , MD  02/18/2019 10:06 AM    [facility]

## 2019-02-18 ENCOUNTER — Encounter: Payer: Self-pay | Admitting: Cardiovascular Disease

## 2019-02-18 ENCOUNTER — Encounter

## 2019-02-18 ENCOUNTER — Other Ambulatory Visit: Payer: Self-pay

## 2019-02-18 ENCOUNTER — Telehealth (INDEPENDENT_AMBULATORY_CARE_PROVIDER_SITE_OTHER): Payer: 59 | Admitting: Cardiovascular Disease

## 2019-02-18 VITALS — Ht 73.5 in | Wt 227.0 lb

## 2019-02-18 DIAGNOSIS — Z7189 Other specified counseling: Secondary | ICD-10-CM

## 2019-02-18 DIAGNOSIS — R7989 Other specified abnormal findings of blood chemistry: Secondary | ICD-10-CM

## 2019-02-18 DIAGNOSIS — I2699 Other pulmonary embolism without acute cor pulmonale: Secondary | ICD-10-CM | POA: Diagnosis not present

## 2019-02-18 DIAGNOSIS — R778 Other specified abnormalities of plasma proteins: Secondary | ICD-10-CM

## 2019-02-18 NOTE — Patient Instructions (Signed)
Medication Instructions:  Your physician recommends that you continue on your current medications as directed. Please refer to the Current Medication list given to you today.  If you need a refill on your cardiac medications before your next appointment, please call your pharmacy.    Lab work: None Ordered    Testing/Procedures: Your physician has requested that you have an echocardiogram in 4 months one week before your office visit with Dr. Acie Fredrickson. Echocardiography is a painless test that uses sound waves to create images of your heart. It provides your doctor with information about the size and shape of your heart and how well your heart's chambers and valves are working. This procedure takes approximately one hour. There are no restrictions for this procedure.    Follow-Up: At [facility], you and your health needs are our priority.  As part of our continuing mission to provide you with exceptional heart care, we have created designated Provider Care Teams.  These Care Teams include your primary Cardiologist (physician) and Advanced Practice Providers (APPs -  Physician Assistants and Nurse Practitioners) who all work together to provide you with the care you need, when you need it. You will need a follow up appointment in:  4 months.  Please call our office 2 months in advance to schedule this appointment.  You may see Dr. Acie Fredrickson or one of the following Advanced Practice Providers on your designated Care Team: Richardson Dopp, PA-C [place], [place] . Daune Perch, NP

## 2019-02-21 ENCOUNTER — Ambulatory Visit: Payer: 59 | Admitting: Emergency Medicine

## 2019-02-21 ENCOUNTER — Other Ambulatory Visit: Payer: Self-pay

## 2019-02-21 ENCOUNTER — Encounter: Payer: Self-pay | Admitting: Emergency Medicine

## 2019-02-21 DIAGNOSIS — I2699 Other pulmonary embolism without acute cor pulmonale: Secondary | ICD-10-CM

## 2019-02-21 NOTE — Patient Instructions (Addendum)
We will plan to continue your Eliquis through the end of July. Then stop We will follow up your repeat echocardiogram We will perform blood work one month after your Eliquis ends.  We will plan to use short term Eliquis when you travel by plane to [place], in addition to compression stockings and staying as mobile as possible.  Follow with Dr Lamonte Sakai end of August to arrange for blood work.

## 2019-02-21 NOTE — Progress Notes (Signed)
Subjective:    Patient ID: Timothy Cooley, male    DOB: 1956/02/03, 63 y.o.   MRN: [id number]  HPI 63 year old never smoker with a history of hypertension, HFpEF, admitted in February 2020 with an acute submassive pulmonary embolism following C7-T1 decompression 10/08/2018.  He was started on anticoagulation.  He had an elevated troponin but was hemodynamically stable without any evidence of respiratory failure.  An echocardiogram done 10/19/2018 showed intact LV function, mildly reduced right ventricular function.  He has done well since discharge with regard to breathing.  He is very active, rides his bike. Unfortunately he is still dealing with LE neuropathy, some impact on his walking and functional capacity.   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 Hyperlipidemia   . Hypertension      Family History  Problem Relation Age of Onset  . Heart Problems Mother   no hx VTE  Social History   Socioeconomic History  . Marital status: Married    Spouse name: Not on file  . Number of children: Not on file  . Years of education: Not on file  . Highest education level: Not on file  Occupational History  . Not on file  Social Needs  . Financial resource strain: Not on file  . Food insecurity    Worry: Not on file    Inability: Not on file  . Transportation needs    Medical: Not on file    Non-medical: Not on file  Tobacco Use  . Smoking status: Never Smoker  . Smokeless tobacco: Never Used  Substance and Sexual Activity  . Alcohol use: Yes    Alcohol/week: 14.0 standard drinks    Types: 14 Glasses of wine per week  . Drug use: No  . Sexual activity: Yes  Lifestyle  . Physical activity    Days per week: Not on file    Minutes per session: Not on file  . Stress: Not on file  Relationships  . Social Musicianconnections    Talks on phone: Not on file    Gets  together: Not on file    Attends religious service: Not on file    Active member of club or organization: Not on file    Attends meetings of clubs or organizations: Not on file    Relationship status: Not on file  . Intimate partner violence    Fear of current or ex partner: Not on file    Emotionally abused: Not on file    Physically abused: Not on file    Forced sexual activity: Not on file  Other Topics Concern  . Not on file  Social History Narrative  . 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 apixaban (ELIQUIS) 5 MG TABS tablet Take 5 mg by mouth 2 (two) times daily.    Marland Kitchen. atorvastatin (LIPITOR) 10 MG tablet Take 10 mg by mouth daily.    . diphenhydramine-acetaminophen (TYLENOL PM) 25-500 MG TABS tablet Take 1-2 tablets by mouth at bedtime as needed (for sleep).    . losartan (COZAAR) 100 MG tablet Take 100 mg by mouth daily.    . Multiple Vitamins-Minerals (MULTIVITAMIN MEN 50+ PO) Take 1 tablet by mouth daily.      No  facility-administered medications prior to visit.         Objective:   Physical Exam Vitals:   02/21/19 1337  BP: 120/76  Pulse: 65  Temp: 97.8 F (36.6 C)  TempSrc: Oral  SpO2: 100%  Weight: 225 lb 3.2 oz (102.2 kg)  Height: 6' 1.5" (1.867 m)   Gen: Pleasant, well-nourished, in no distress,  normal affect  ENT: No lesions,  mouth clear,  oropharynx clear, no postnasal drip  Neck: No JVD, no stridor  Lungs: No use of accessory muscles, no crackles or wheezing on normal respiration, no wheeze on forced expiration  Cardiovascular: RRR, heart sounds normal, no murmur or gallops, no peripheral edema  Musculoskeletal: No deformities, no cyanosis or clubbing  Neuro: alert, awake, non focal  Skin: Warm, no lesions or rash      Assessment & Plan:   PE (pulmonary thromboembolism) ([facility]) Large provoked pulmonary embolism with some  evidence for right heart compromise.  Agree with 6 months of anticoagulation and then check for any evidence of hypercoagulability.  If present then we could consider longer term therapy.  Certainly if he has a second clot he would require lifelong.  Agree with repeat echocardiogram which is already pending to ensure resolution of his right heart strain.  I think it would be reasonable for him to appreciate his airplane trips to [place] with DOAC.  Although there are no good clinical trials to evaluate this, anecdotal evidence does support.  We talked about this today.   Baltazar Apo, MD, PhD 02/21/2019, 2:10 PM [facility] [phone] or if no answer [phone]

## 2019-02-21 NOTE — Assessment & Plan Note (Signed)
Large provoked pulmonary embolism with some evidence for right heart compromise.  Agree with 6 months of anticoagulation and then check for any evidence of hypercoagulability.  If present then we could consider longer term therapy.  Certainly if he has a second clot he would require lifelong.  Agree with repeat echocardiogram which is already pending to ensure resolution of his right heart strain.  I think it would be reasonable for him to appreciate his airplane trips to [place] with DOAC.  Although there are no good clinical trials to evaluate this, anecdotal evidence does support.  We talked about this today.

## 2019-04-04 ENCOUNTER — Institutional Professional Consult (permissible substitution): Payer: 59 | Admitting: Emergency Medicine

## 2019-06-16 ENCOUNTER — Ambulatory Visit: Payer: 59 | Admitting: Emergency Medicine

## 2019-06-20 ENCOUNTER — Other Ambulatory Visit (HOSPITAL_COMMUNITY): Payer: 59

## 2019-06-23 ENCOUNTER — Encounter: Payer: Self-pay | Admitting: Emergency Medicine

## 2019-06-23 ENCOUNTER — Telehealth (INDEPENDENT_AMBULATORY_CARE_PROVIDER_SITE_OTHER): Payer: 59 | Admitting: Emergency Medicine

## 2019-06-23 DIAGNOSIS — I2699 Other pulmonary embolism without acute cor pulmonale: Secondary | ICD-10-CM

## 2019-06-23 NOTE — Assessment & Plan Note (Signed)
Large pulmonary embolism, provoked, completed over 6 months of Eliquis.  Denies any symptoms currently.  He is due for his echocardiogram to ensure improvement in his RV function.  Most important thing for [place] to do at this time is to check hypercoagulability panel ensure no findings that would predispose him to a second clot.  If present then we can talk about getting back on anticoagulation.  If not then he can probably stay off.  If he were to develop a second clot then he would need anticoagulation for life.  I will call him with the hypercoagulability panel results.

## 2019-06-23 NOTE — Progress Notes (Signed)
Virtual Visit via Video Note  I connected with Timothy Cooley on 06/23/19 at 11:45 AM EDT by a video enabled telemedicine application and verified that I am speaking with the correct person using two identifiers.  Location: Patient: Home Provider: Office   I discussed the limitations of evaluation and management by telemedicine and the availability of in person appointments. The patient expressed understanding and agreed to proceed.  History of Present Illness: Timothy Cooley is a 63 year old gentleman, never smoker with hypertension, HFpEF.  I met him in June after he experienced a large provoked PE with some right heart strain following a surgical procedure in February   Observations/Objective: He has not yet had his repeat echocardiogram to evaluate RV function.  He was on Eliquis February 2020 through August 2020. Planning for TTE end of this week. He is still experiencing neuropathy, limits his activity. No SOB, no leg swelling.   Assessment and Plan: PE (pulmonary thromboembolism) ([place]) Large pulmonary embolism, provoked, completed over 6 months of Eliquis.  Denies any symptoms currently.  He is due for his echocardiogram to ensure improvement in his RV function.  Most important thing for [place] to do at this time is to check hypercoagulability panel ensure no findings that would predispose him to a second clot.  If present then we can talk about getting back on anticoagulation.  If not then he can probably stay off.  If he were to develop a second clot then he would need anticoagulation for life.  I will call him with the hypercoagulability panel results.    Follow Up Instructions: I will call with lab results, plan next steps   I discussed the assessment and treatment plan with the patient. The patient was provided an opportunity to ask questions and all were answered. The patient agreed with the plan and demonstrated an understanding of the instructions.   The patient was advised to call  back or seek an in-person evaluation if the symptoms worsen or if the condition fails to improve as anticipated.  I provided 20 minutes of non-face-to-face time during this encounter.   Collene Gobble, MD

## 2019-06-23 NOTE — Patient Instructions (Signed)
Please come in to our office at your earliest convenience to have your bloodwork drawn. We will call you with the results of your bloodwork, and will base your follow-up on the results of this test. Please call our office if your symptoms worsen.

## 2019-06-27 ENCOUNTER — Other Ambulatory Visit: Payer: Self-pay

## 2019-06-27 ENCOUNTER — Ambulatory Visit (HOSPITAL_COMMUNITY): Payer: 59 | Attending: Cardiology

## 2019-06-27 DIAGNOSIS — I2699 Other pulmonary embolism without acute cor pulmonale: Secondary | ICD-10-CM | POA: Diagnosis not present

## 2019-06-27 DIAGNOSIS — R778 Other specified abnormalities of plasma proteins: Secondary | ICD-10-CM | POA: Insufficient documentation

## 2019-06-27 DIAGNOSIS — R7989 Other specified abnormal findings of blood chemistry: Secondary | ICD-10-CM

## 2019-06-30 ENCOUNTER — Other Ambulatory Visit: Payer: 59

## 2019-07-01 NOTE — Progress Notes (Signed)
Addendum:  Correction to Assessment and Plan  I think he should take DVT prophylaxis before and after any future surgeries since he has had a large saddle pulmonary embolus.     Mertie Moores, MD  07/01/2019 9:05 AM    [address],  [address], [place]  [id number] Pager [phone] Phone: [phone]; Fax: [phone]

## 2019-07-03 ENCOUNTER — Other Ambulatory Visit: Payer: Self-pay | Admitting: Emergency Medicine

## 2019-07-10 LAB — HYPERCOAGULABLE PANEL, COMPREHENSIVE
APTT: 24.1 s
AT III Act/Nor PPP Chro: 109 %
Act. Prt C Resist w/FV Defic.: 2.7 ratio
Anticardiolipin Ab, IgG: <10 [GPL'U]
Anticardiolipin Ab, IgM: 20 [MPL'U]
Beta-2 Glycoprotein I, IgA: <10 SAU
Beta-2 Glycoprotein I, IgG: <10 SGU
Beta-2 Glycoprotein I, IgM: <10 SMU
DRVVT Screen Seconds: 37.3 s
Factor VII Antigen**: 116 %
Factor VIII Activity: 169 % — ABNORMAL HIGH
Hexagonal Phospholipid Neutral: 3 s
Homocysteine: 10.2 umol/L
Prot C Ag Act/Nor PPP Imm: 111 %
Prot S Ag Act/Nor PPP Imm: 85 %
Protein C Ag/FVII Ag Ratio**: 1 ratio
Protein S Ag/FVII Ag Ratio**: 0.7 ratio

## 2019-08-15 ENCOUNTER — Other Ambulatory Visit: Payer: Self-pay | Admitting: Neurological Surgery

## 2019-08-15 ENCOUNTER — Other Ambulatory Visit (HOSPITAL_COMMUNITY): Payer: Self-pay | Admitting: Neurological Surgery

## 2019-08-15 DIAGNOSIS — G959 Disease of spinal cord, unspecified: Secondary | ICD-10-CM

## 2019-08-26 ENCOUNTER — Ambulatory Visit (HOSPITAL_COMMUNITY)
Admission: RE | Admit: 2019-08-26 | Discharge: 2019-08-26 | Disposition: A | Discharge status: Home / Self Care | Payer: 59 | Source: Ambulatory Visit | Attending: Neurological Surgery | Admitting: Neurological Surgery

## 2019-08-26 ENCOUNTER — Other Ambulatory Visit: Payer: Self-pay

## 2019-08-26 DIAGNOSIS — G959 Disease of spinal cord, unspecified: Secondary | ICD-10-CM | POA: Insufficient documentation

## 2019-08-26 MED ORDER — IOHEXOL 300 MG/ML  SOLN
10.0000 mL | Freq: Once | INTRAMUSCULAR | Status: AC | PRN
  Administered 2019-08-26: 08:00:00 7 mL via INTRATHECAL

## 2019-08-26 MED ORDER — DIAZEPAM 5 MG PO TABS
10.0000 mg | ORAL_TABLET | Freq: Once | ORAL | Status: AC
  Administered 2019-08-26: 10 mg via ORAL
  Filled 2019-08-26: qty 2

## 2019-08-26 MED ORDER — LIDOCAINE HCL (PF) 1 % IJ SOLN
INTRAMUSCULAR | Status: AC
  Administered 2019-08-26: 08:00:00 2 mL via INTRADERMAL
  Filled 2019-08-26: qty 5

## 2019-08-26 MED ORDER — OXYCODONE-ACETAMINOPHEN 5-325 MG PO TABS: 1.0000 | ORAL_TABLET | ORAL | Status: DC | PRN

## 2019-08-26 MED ORDER — ONDANSETRON HCL 4 MG/2ML IJ SOLN: 4.0000 mg | Freq: Four times a day (QID) | INTRAMUSCULAR | Status: DC | PRN

## 2019-08-26 MED ORDER — LIDOCAINE HCL (PF) 1 % IJ SOLN: 5.0000 mL | Freq: Once | INTRAMUSCULAR | Status: AC

## 2019-08-26 NOTE — Procedures (Addendum)
Patient with history of decompression and fusion C7 T1. Complains of back and leg stiffness with weakness and decreased stamina. Myelogram to be performed because of hardware in place .  Pre op ZO:[id number] of cervico thoracic junction Post op WJ:XBJY Procedure:Total myelogram Surgeon:Lempi Edwin Puncture level:L3-4 Fluid color:clear colorless Injection: iohexol 300  7 ml Findings:suspicious for stenosis at C7-T1 with bulging discs in lumbar spine.

## 2019-08-26 NOTE — Discharge Instructions (Signed)
Myelogram and Lumbar Puncture Discharge Instructions  1. Go home and rest quietly for the next 24 hours.  It is important to lie flat for the next 24 hours.  Get up only to go to the restroom.  You may lie in the bed or on a couch on your back, your stomach, your left side or your right side.  You may have one pillow under your head.  You may have pillows between your knees while you are on your side or under your knees while you are on your back.  2. DO NOT drive today.  Recline the seat as far back as it will go, while still wearing your seat belt, on the way home.  3. You may get up to go to the bathroom as needed.  You may sit up for 10 minutes to eat.  You may resume your normal diet and medications unless otherwise indicated.  4. The incidence of headache, nausea, or vomiting is about 5% (one in 20 patients).  If you develop a headache, lie flat and drink plenty of fluids until the headache goes away.  Caffeinated beverages may be helpful.  If you develop severe nausea and vomiting or a headache that does not go away with flat bed rest, call Dr Elsner's office.  5. You may resume normal activities after your 24 hours of bed rest is over; however, do not exert yourself strongly or do any heavy lifting tomorrow.  6. Call your physician for a follow-up appointment.  The results of your myelogram will be sent directly to your physician by the following day.  7. If you have any questions or if complications develop after you arrive home, please call Dr Elsner's office.  Discharge instructions have been explained to the patient.  The patient, or the person responsible for the patient, fully understands these instructions.   

## 2020-01-23 IMAGING — RF DG C-ARM 61-120 MIN
1 series · 3 of 3 positions shown · non-contrast
Comparison: None.

CLINICAL DATA: C7-T1 laminectomy

EXAM:
CERVICAL SPINE - 2-3 VIEW; DG C-ARM 61-120 MIN

[Series 1: run · 3 of 3 slices shown]
[im 1/3]
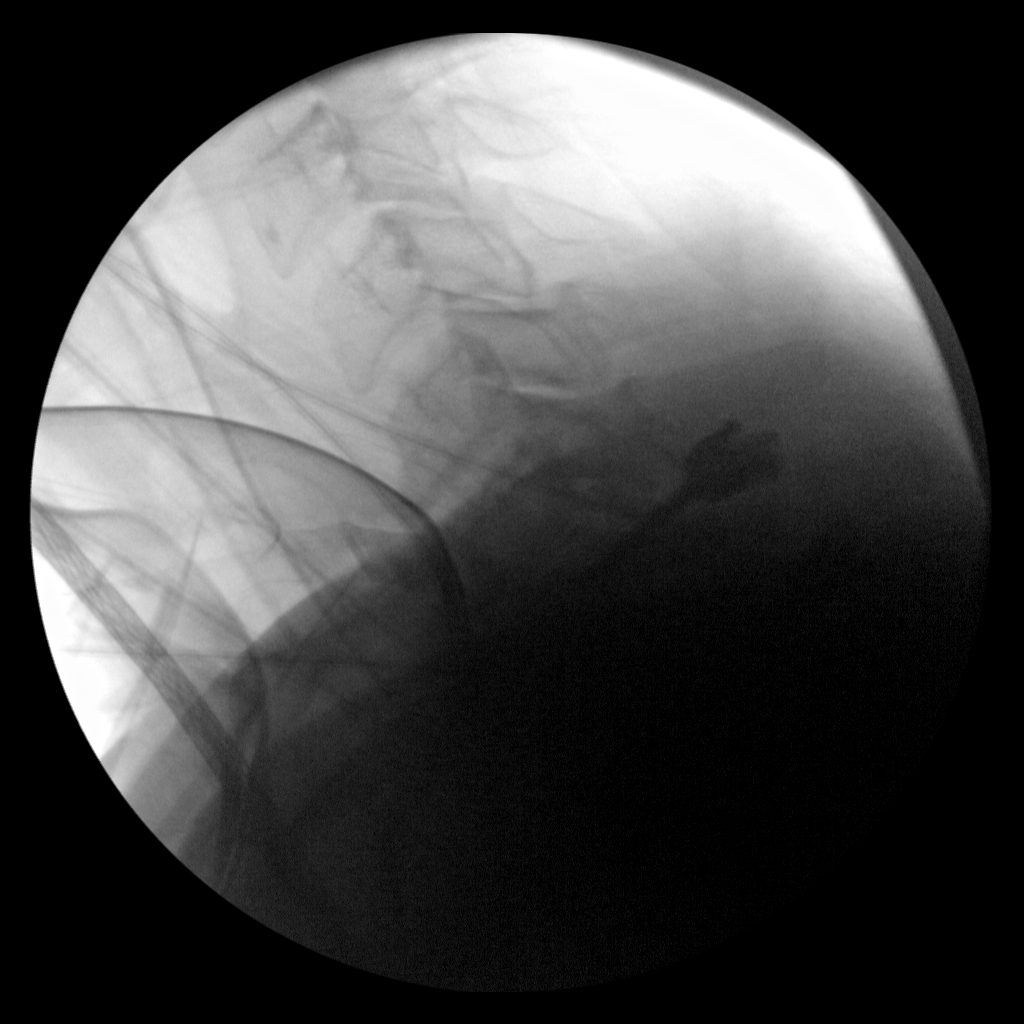
[im 2/3]
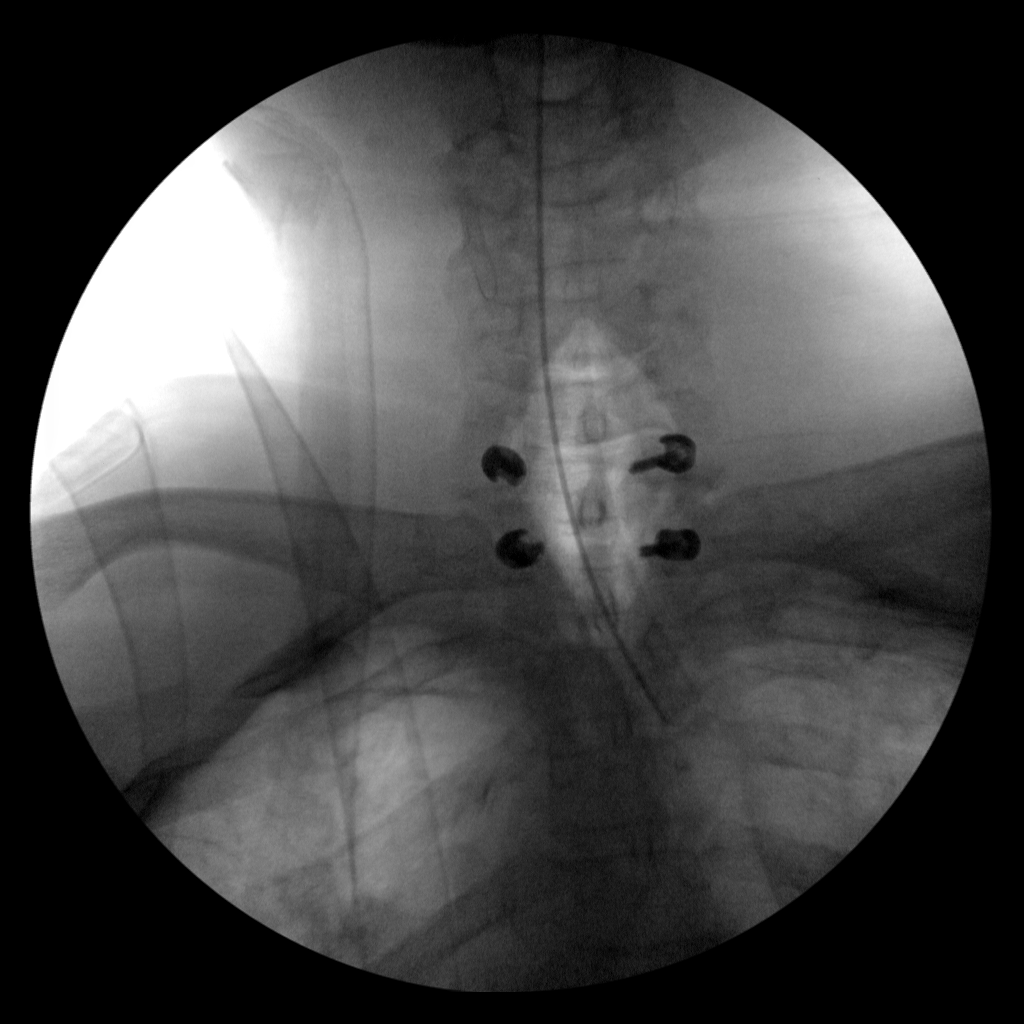
[im 3/3]
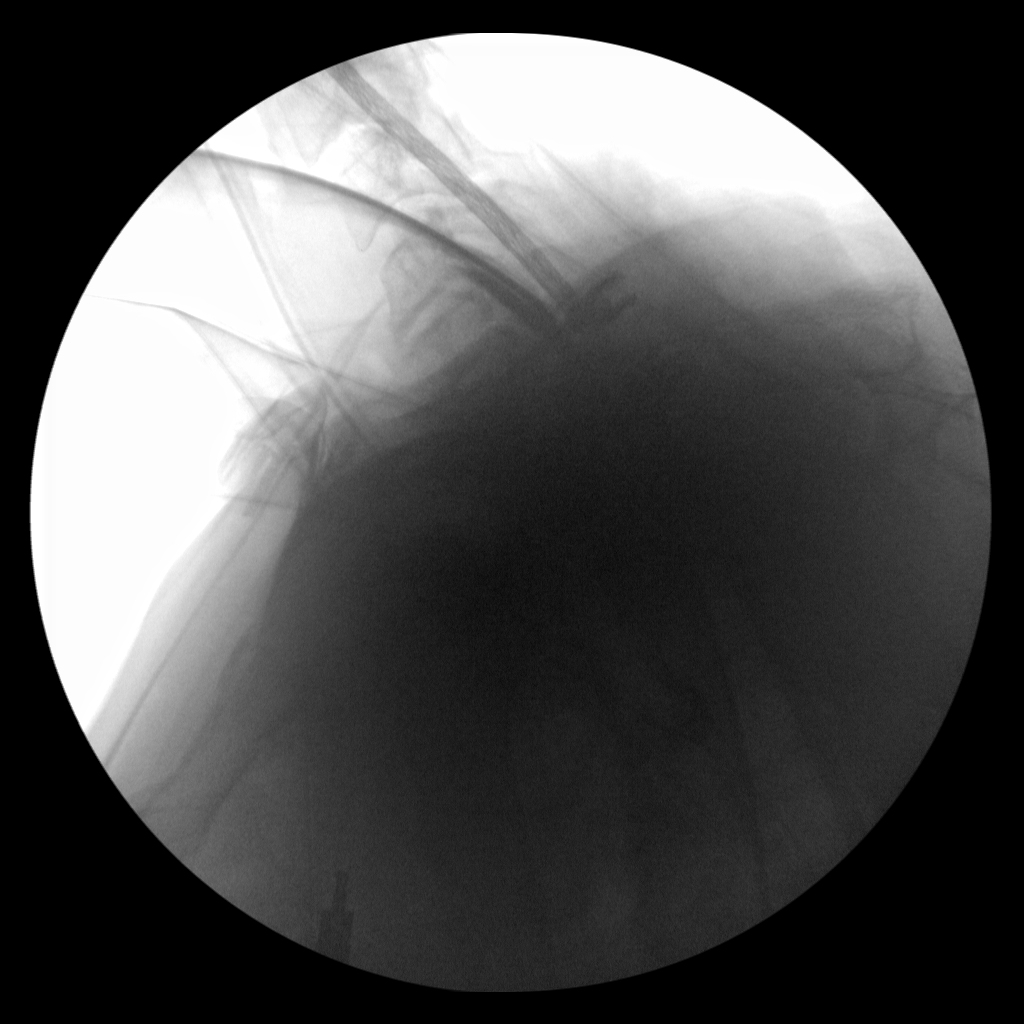

[3 of 3 positions shown; findings below may reference images not displayed]

FLUOROSCOPY TIME:  Fluoroscopy Time:  22 seconds

Radiation Exposure Index (if provided by the fluoroscopic device):
Not available

Number of Acquired Spot Images: 3
FINDINGS: Images demonstrate pedicle screws at C7 and T1 with wide operative
exposure.
IMPRESSION: Pedicle screws at C7-T1.

## 2020-02-02 IMAGING — DX DG CHEST 2V
2 series · 2 of 2 positions shown · non-contrast
Comparison: 09/06/2014.

CLINICAL DATA: Shortness of breath on exertion.

EXAM:
CHEST - 2 VIEW

[chest pa]
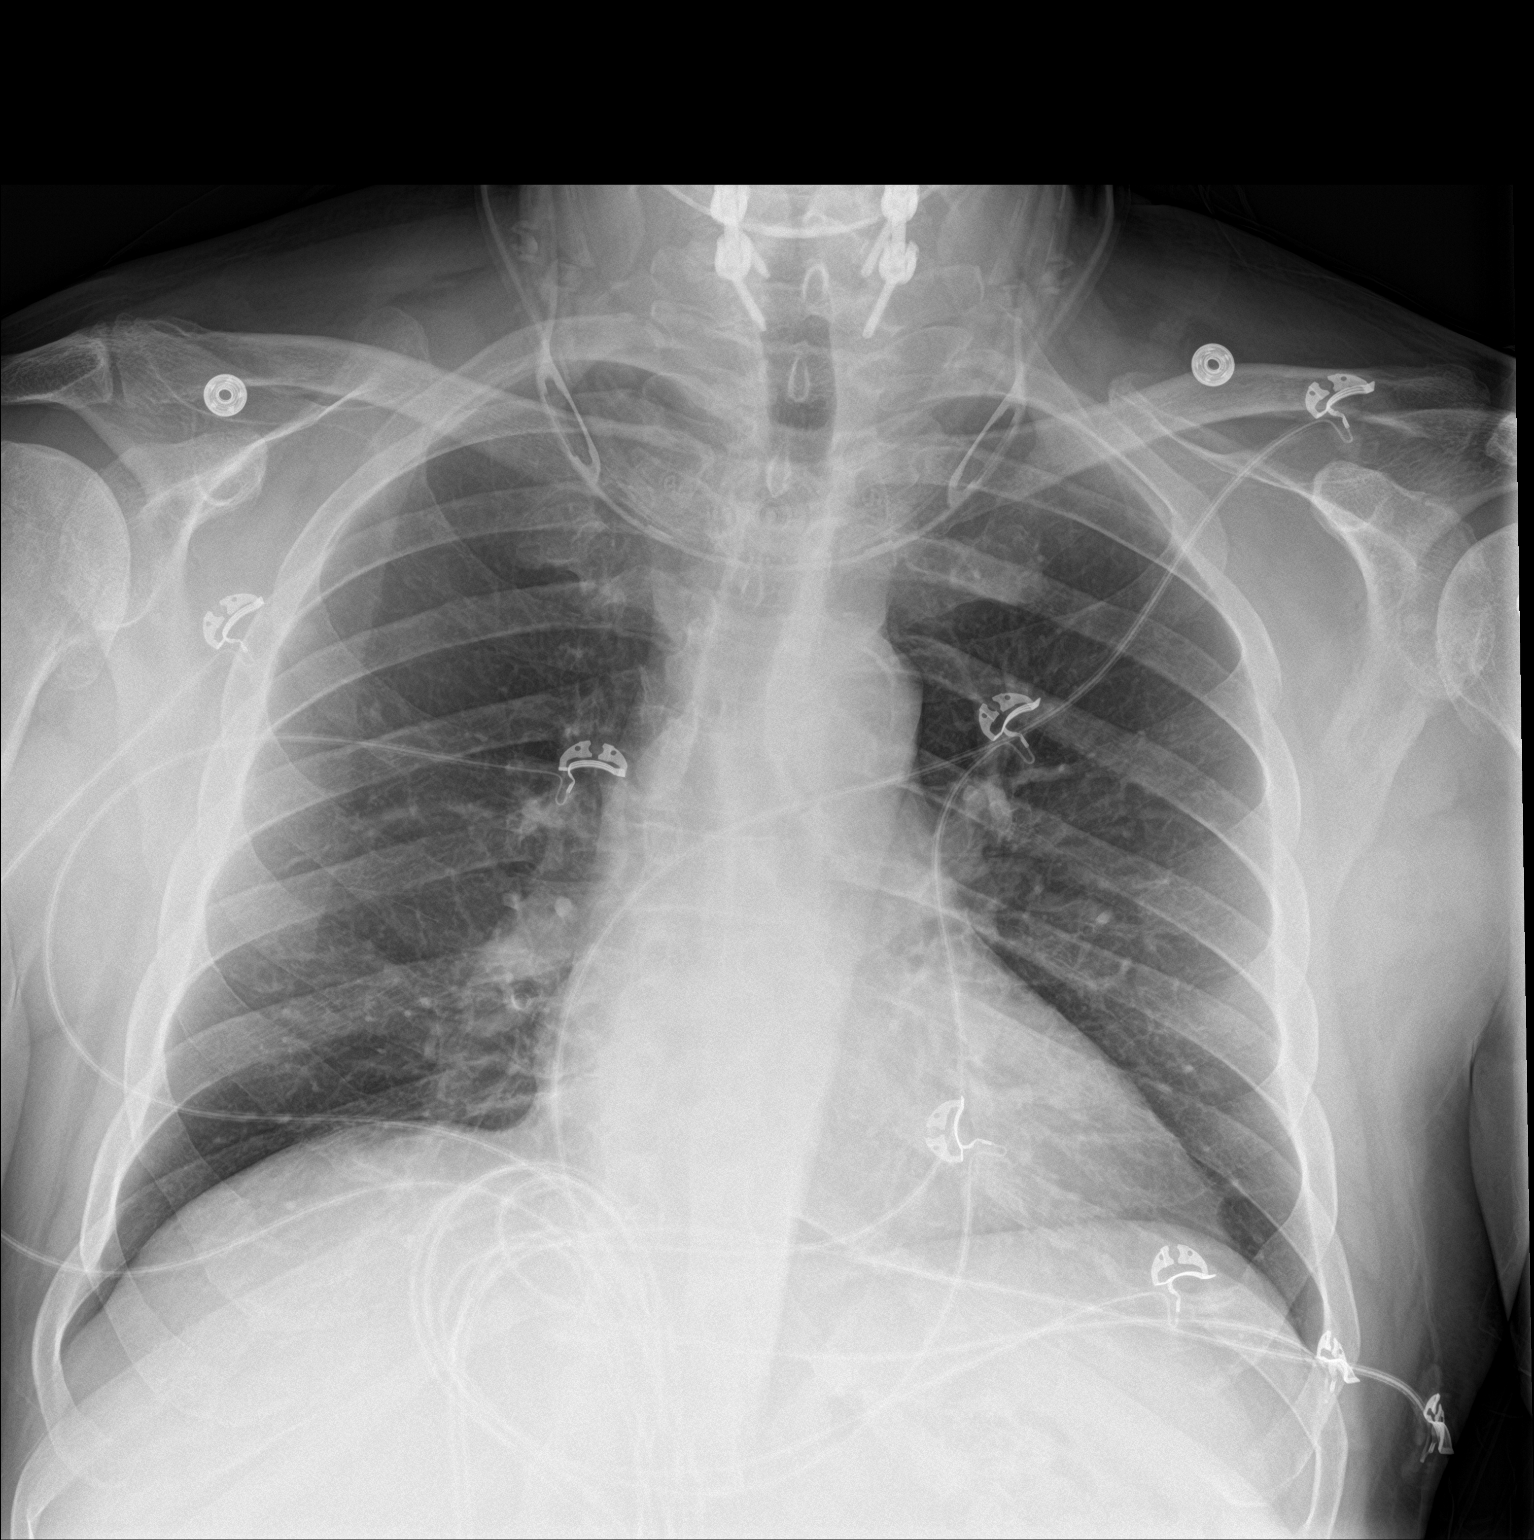

[chest lat]
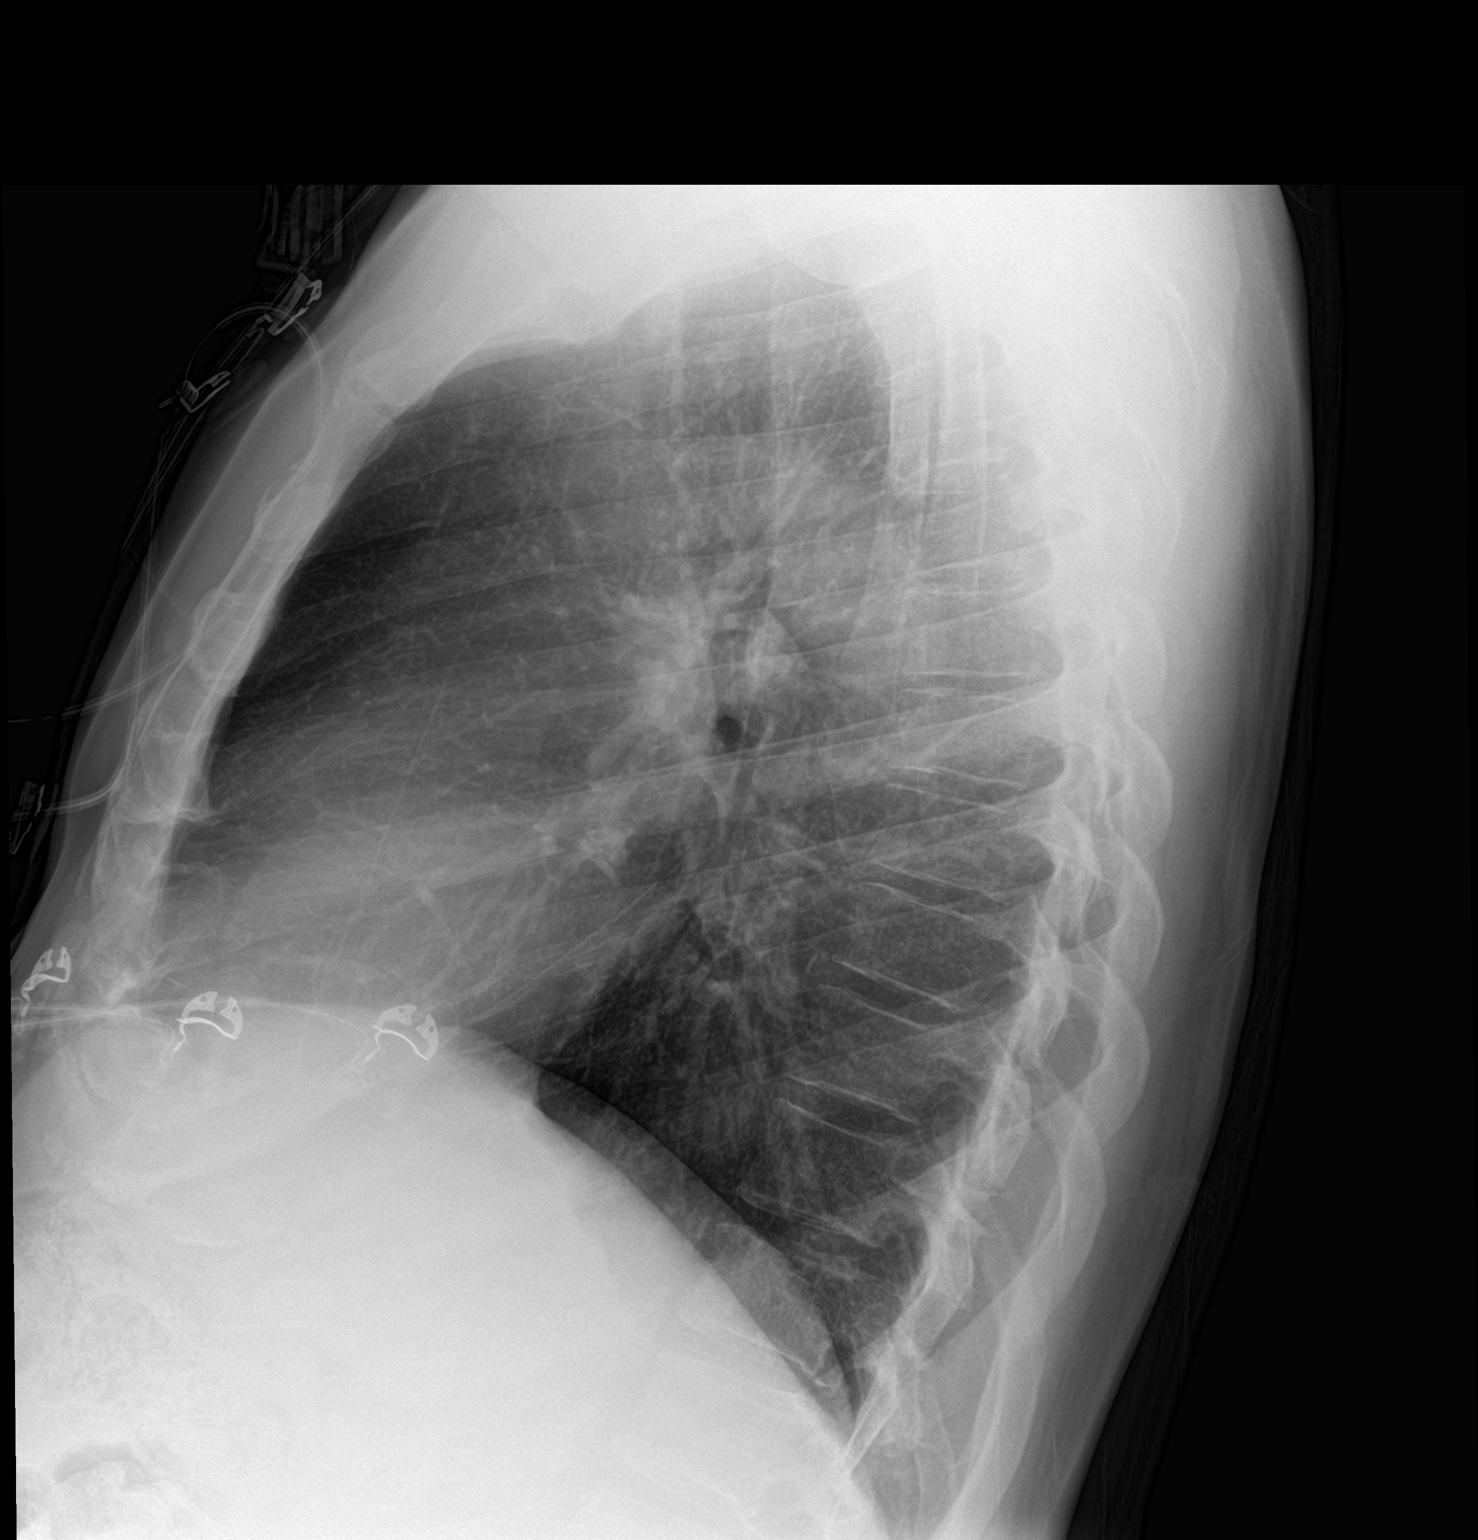

[2 of 2 positions shown; findings below may reference images not displayed]

FINDINGS: Normal sized heart. Clear lungs. Cervical spine fixation hardware.
Moderate left and moderate to marked right glenohumeral joint
degenerative changes.
IMPRESSION: No acute abnormality.

## 2020-02-02 IMAGING — CT CT ANGIO CHEST
2 of 7 series · 17 of 46 positions shown · IV contrast (iopamidol)
Comparison: Chest radiographs obtained earlier today.

CLINICAL DATA: Shortness of breath today.

EXAM:
CT ANGIOGRAPHY CHEST WITH CONTRAST
TECHNIQUE: Multidetector CT imaging of the chest was performed using the
standard protocol during bolus administration of intravenous
contrast. Multiplanar CT image reconstructions and MIPs were
obtained to evaluate the vascular anatomy.
CONTRAST:  75mL C8FOBJ-XR0 IOPAMIDOL (C8FOBJ-XR0) INJECTION 76%

[Series 6: thins · axial · 0.75mm/px · z∈[-319,-72]mm · 14 of 398 slices shown]
[im 23/398  lung]
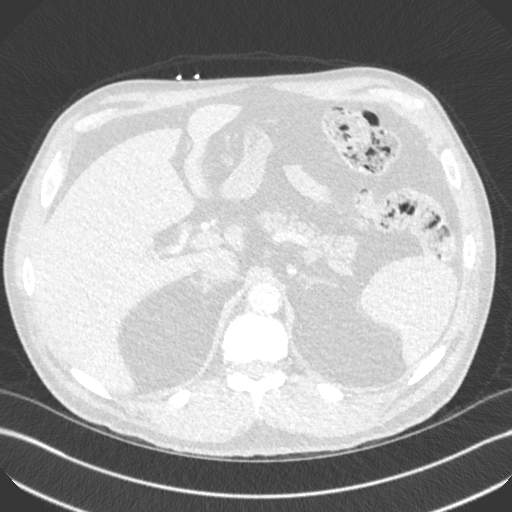
[im 45/398  soft-tissue]
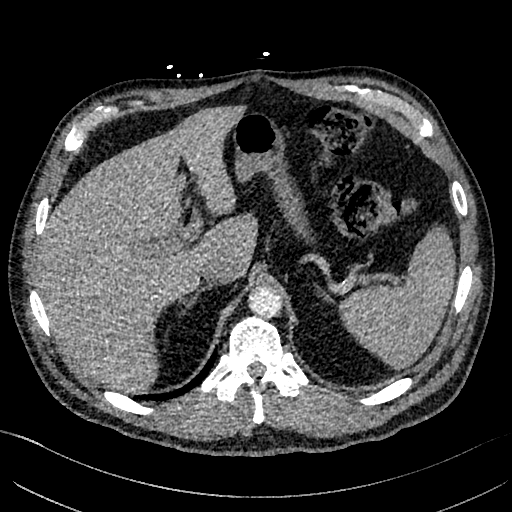
[im 89/398  lung]
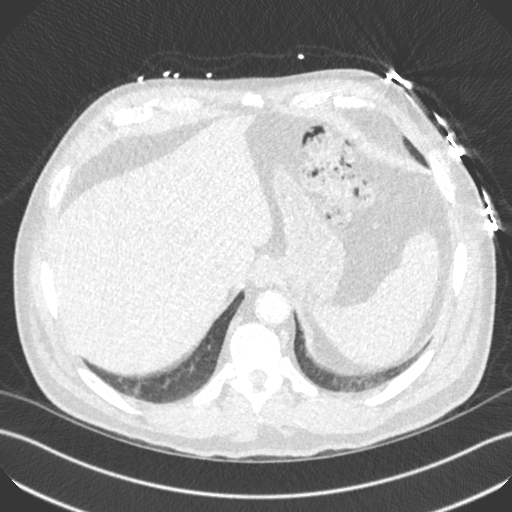
[im 111/398  soft-tissue]
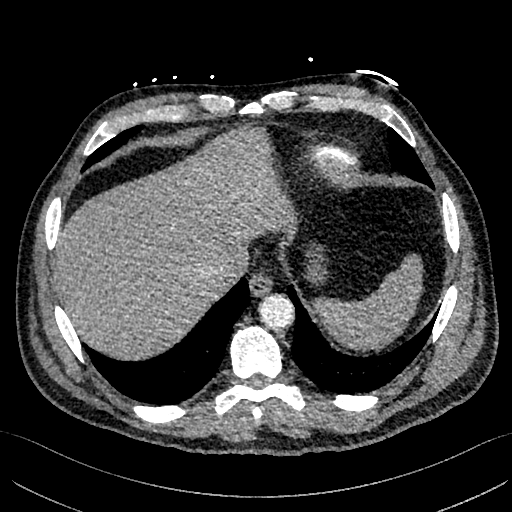
[im 133/398  lung]
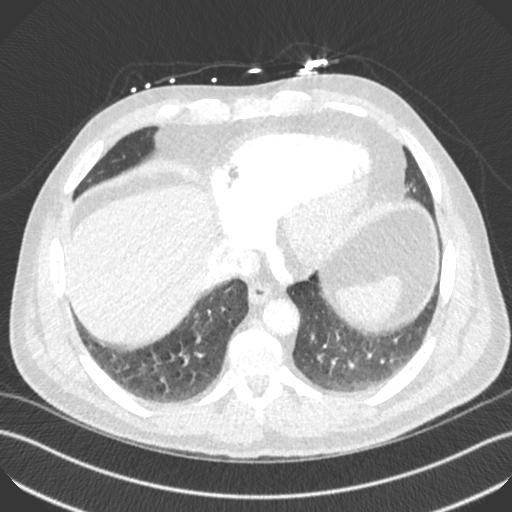
[im 155/398  soft-tissue]
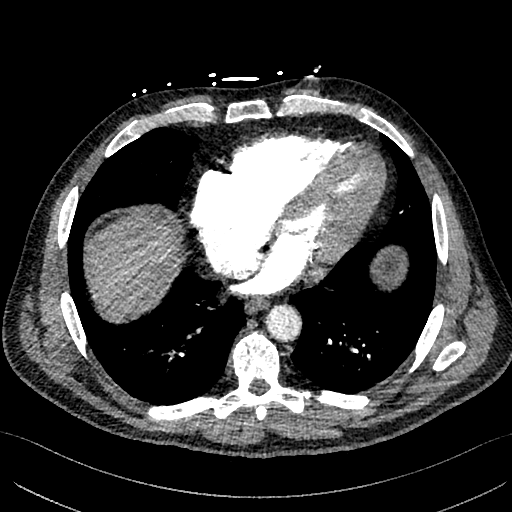
[im 177/398  lung]
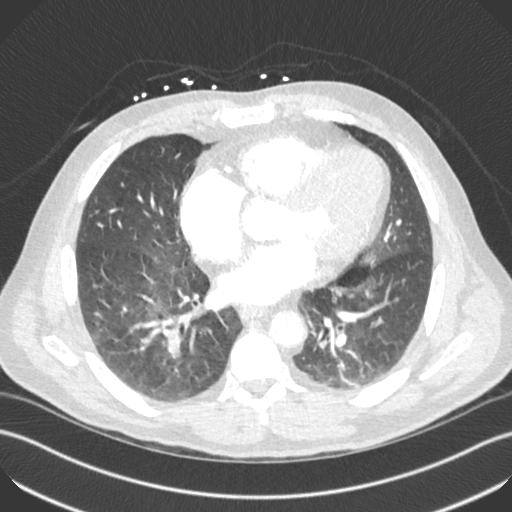
[im 221/398  soft-tissue]
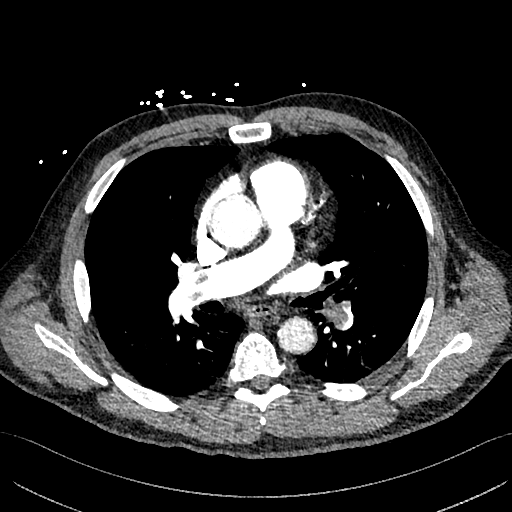
[im 243/398  lung]
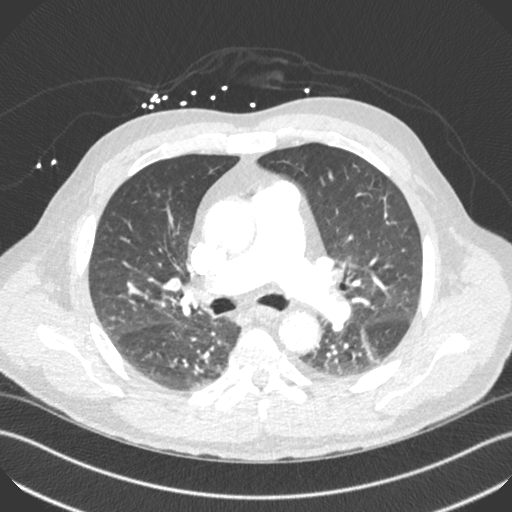
[im 265/398  soft-tissue]
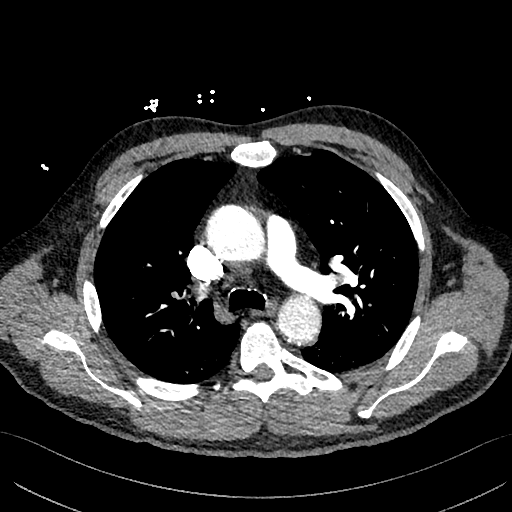
[im 287/398  lung]
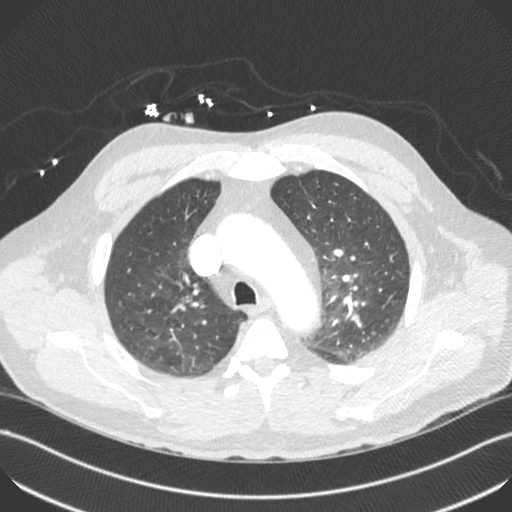
[im 309/398  soft-tissue]
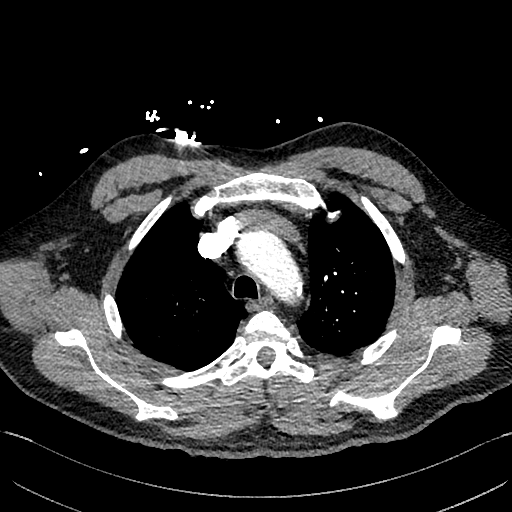
[im 353/398  lung]
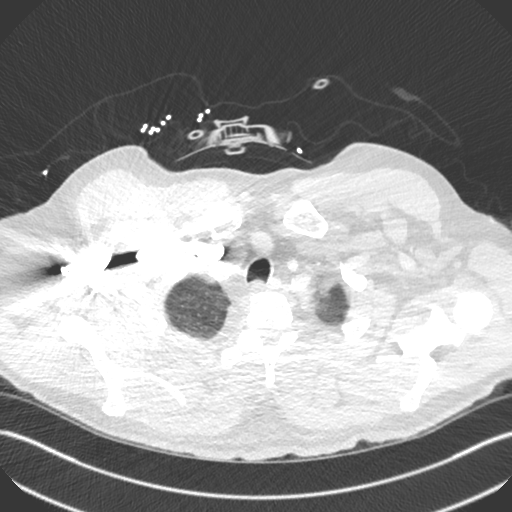
[im 375/398  soft-tissue]
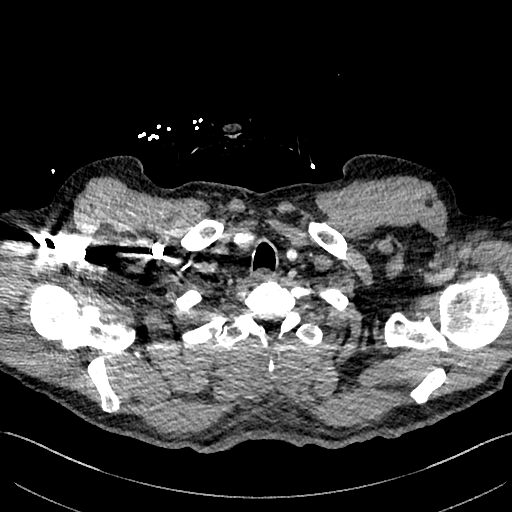

[Series 8: cor · coronal · 0.56mm/px · 3 of 135 slices shown]
[im 34/135  soft-tissue]
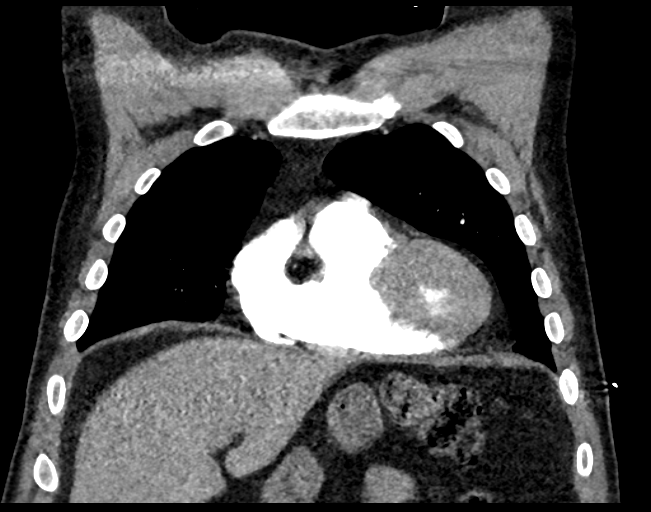
[im 68/135  soft-tissue]
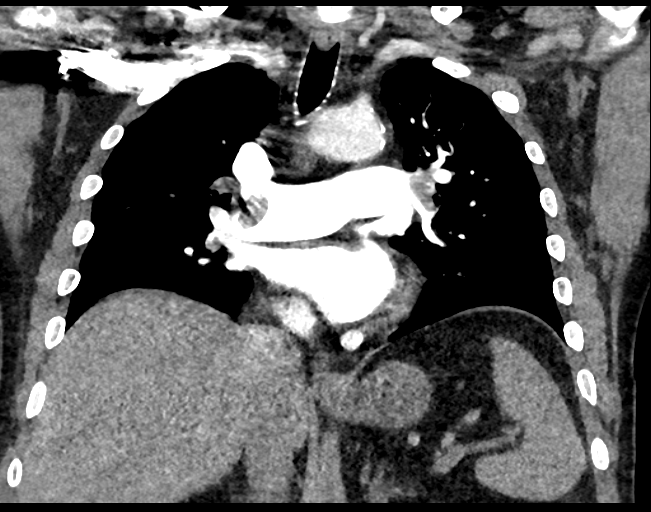
[im 101/135  soft-tissue]
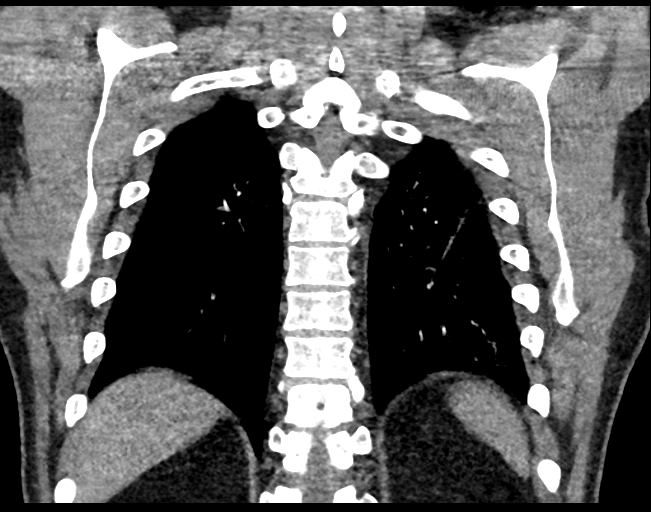

[17 of 46 positions shown; findings below may reference images not displayed]

FINDINGS: Cardiovascular: Multiple bilateral pulmonary emboli with small ones
in the right and left main pulmonary arteries and larger ones in the
lobar branches, including and almost occluding multiple branches.
The left ventricular lumen is very small with a maximum diameter of
2.1 cm and pronounced diffuse left ventricular hypertrophy. The
right ventricle is dilated with a maximum diameter of 5.3 cm. The
right ventricular to left ventricular ratio is 2.52.

Mildly enlarged pulmonary arteries with the main pulmonary artery
measuring 3.4 cm in transverse diameter. Calcific coronary artery
and aortic atherosclerosis.

Mediastinum/Nodes: No enlarged lymph nodes. The thyroid gland is not
included. Unremarkable esophagus.

Lungs/Pleura: Mildly prominent interstitial markings. No pleural
fluid or airspace consolidation. Mild linear atelectasis or scarring
in the left lower lobe.

Upper Abdomen: Unremarkable.

Musculoskeletal: Minimal thoracic spine degenerative changes. Lower
cervical spine fixation hardware.

Review of the MIP images confirms the above findings.
IMPRESSION: 1. Positive for extensive bilateral acute PE with CT evidence of
right heart strain (RV/LV Ratio = 2.52) consistent with at least
submassive (intermediate risk) PE. The presence of right heart
strain has been associated with an increased risk of morbidity and
mortality. Please activate Code PE by paging [phone].
2. Mildly enlarged central pulmonary arteries, suggesting pulmonary
arterial hypertension.
3. Mild calcific coronary artery and aortic atherosclerosis.
4. Mild chronic interstitial lung disease or minimal interstitial
pulmonary edema.

Critical Value/emergent results were called by telephone at the time
of interpretation on 10/18/2018 at [DATE] to Dr. YING SAYLER ,
who verbally acknowledged these results.

Aortic Atherosclerosis ([id number].W).

## 2020-04-15 ENCOUNTER — Other Ambulatory Visit (HOSPITAL_COMMUNITY): Payer: Self-pay | Admitting: Internal Medicine

## 2020-04-15 ENCOUNTER — Ambulatory Visit (HOSPITAL_COMMUNITY)
Admission: RE | Admit: 2020-04-15 | Discharge: 2020-04-15 | Disposition: A | Discharge status: Home / Self Care | Payer: 59 | Source: Ambulatory Visit | Attending: Surgery | Admitting: Surgery

## 2020-04-15 ENCOUNTER — Other Ambulatory Visit: Payer: Self-pay

## 2020-04-15 DIAGNOSIS — R600 Localized edema: Secondary | ICD-10-CM | POA: Diagnosis not present

## 2020-12-18 ENCOUNTER — Encounter: Payer: Self-pay | Admitting: Cardiovascular Disease

## 2020-12-18 NOTE — Progress Notes (Signed)
Cardiology Office Note:    Date:  12/20/2020   ID:  Timothy Cooley, DOB [birth year], MRN [id number]  PCP:  Timothy Ferretti, DO   [facility]  Cardiologist:  Timothy Cooley  Advanced Practice Provider:  No care team member to display Electrophysiologist:  None    :[id number]}   Referring MD: Timothy Ferretti, DO   Chief Complaint  Patient presents with  . pulmonary embolus    December 20, 2020   Timothy Cooley is a 65 y.o. male with a hx of  Pulmonary embolus, HTN I saw Celene Kras vis telemedicine visit in 2020  Hx of PE, chronic diastolic CHF, HTN, HLD  I saw him in June 2020 He had neck surgery in Jan. 2016  He was called to come back in for follow up visit  He is no longer on Eliquis.   ECG shows Afib with controlled Rate. Alcohol - 2 glasses of wine with dinner .  No OSA symptoms   Has numbness from his chest down due to a spinal cord.      Past Medical History:  Diagnosis Date  . Hyperlipidemia   . Hypertension     Past Surgical History:  Procedure Laterality Date  . APPLICATION OF ROBOTIC ASSISTANCE FOR SPINAL PROCEDURE N/A 10/08/2018   Procedure: APPLICATION OF ROBOTIC ASSISTANCE FOR SPINAL PROCEDURE;  Surgeon: Timothy Abu, MD;  Location: [facility] OR;  Service: Neurosurgery;  Laterality: N/A;  . NO PAST SURGERIES    . none    . POSTERIOR CERVICAL FUSION/FORAMINOTOMY N/A 10/08/2018   Procedure: Cervical Seven to Thoracic One laminectomy/foraminotomy with posterior fusion Cervical Seven to Thoracic One using Mazor;  Surgeon: Timothy Abu, MD;  Location: [facility] OR;  Service: Neurosurgery;  Laterality: N/A;  Cervical Seven to Thoracic One laminectomy/foraminotomy with posterior fusion Cervical Seven to Thoracic One using Mazor    Current Medications: Current Meds  Medication Sig  . apixaban (ELIQUIS) 5 MG TABS tablet Take 1 tablet (5 mg total) by mouth 2 (two) times daily.  Marland Kitchen atorvastatin (LIPITOR) 10 MG tablet Take 10 mg by mouth daily.  .  diphenhydramine-acetaminophen (TYLENOL PM) 25-500 MG TABS tablet Take 1-2 tablets by mouth at bedtime as needed (for sleep).  . losartan (COZAAR) 100 MG tablet Take 100 mg by mouth daily.  . Multiple Vitamins-Minerals (MULTIVITAMIN MEN 50+ PO) Take 1 tablet by mouth daily.      Allergies:   Patient has no known allergies.   Social History   Socioeconomic History  . Marital status: Married    Spouse name: Not on file  . Number of children: Not on file  . Years of education: Not on file  . Highest education level: Not on file  Occupational History  . Not on file  Tobacco Use  . Smoking status: Never Smoker  . Smokeless tobacco: Never Used  Vaping Use  . Vaping Use: Never used  Substance and Sexual Activity  . Alcohol use: Yes    Alcohol/week: 14.0 standard drinks    Types: 14 Glasses of wine per week  . Drug use: No  . Sexual activity: Yes  Other Topics Concern  . Not on file  Social History Narrative  . Not on file   Social Determinants of Health   Financial Resource Strain: Not on file  Food Insecurity: Not on file  Transportation Needs: Not on file  Physical Activity: Not on file  Stress: Not on file  Social Connections: Not on file  Family History: The patient's family history includes Heart Problems in his mother.  ROS:   Please see the history of present illness.     All other systems reviewed and are negative.  EKGs/Labs/Other Studies Reviewed:    The following studies were reviewed today:   EKG: December 20, 2020: Atrial fibrillation with heart rate of 81.  No ST or T wave changes.  Recent Labs: No results found for requested labs within last 8760 hours.  Recent Lipid Panel    Component Value Date/Time   CHOL 179 09/07/2014 0109   TRIG 96 09/07/2014 0109   HDL 49 09/07/2014 0109   CHOLHDL 3.7 09/07/2014 0109   VLDL 19 09/07/2014 0109   LDLCALC 111 (H) 09/07/2014 0109     Risk Assessment/Calculations:    CHA2DS2-VASc Score = 2  This  indicates a 2.2% annual risk of stroke. The patient's score is based upon: CHF History: No HTN History: Yes Diabetes History: No Stroke History: No Vascular Disease History: No Age Score: 1 Gender Score: 0    Physical Exam:    VS:  BP 132/78   Pulse 81   Ht 6' 1.5" (1.867 m)   Wt 237 lb (107.5 kg)   SpO2 99%   BMI 30.84 kg/m     Wt Readings from Last 3 Encounters:  12/20/20 237 lb (107.5 kg)  08/26/19 225 lb (102.1 kg)  02/21/19 225 lb 3.2 oz (102.2 kg)     GEN:  Well nourished, well developed in no acute distress HEENT: Normal NECK: No JVD; No carotid bruits LYMPHATICS: No lymphadenopathy CARDIAC: Irregularly irregular.  No significant murmurs. RESPIRATORY:  Clear to auscultation without rales, wheezing or rhonchi  ABDOMEN: Soft, non-tender, non-distended MUSCULOSKELETAL:  No edema; No deformity  SKIN: Warm and dry NEUROLOGIC:  Alert and oriented x 3 PSYCHIATRIC:  Normal affect   ASSESSMENT:    1. Atrial fibrillation, unspecified type (HCC)    PLAN:    In order of problems listed above:  1. 1.  Newly diagnosed atrial fibrillation: He presents with atrial fibrillation which is a new diagnosis for him.  I previously saw him 2 years ago and then again 6 years ago for pulmonary emboli.  He was in sinus rhythm at that time.  He cannot tell that his heart rate is regular at all.  Has not noticed any chest pain or shortness of breath.  He does have some spinal injuries from his previous days playing lacrosse so he is not quite as active as he used to be.    We will restart the Eliquis 5 mg twice a day.  We will check an echocardiogram and check a TSH today.  We will have him return to see an APP in 4 to 6 weeks.  If he is still in atrial fibrillation we will schedule him for DC cardioversion.  We discussed the risk, benefits, options of cardioversion.  He understands and agrees to proceed.      Shared Decision Making/Informed Consent{   The risks (stroke,  cardiac arrhythmias rarely resulting in the need for a temporary or permanent pacemaker, skin irritation or burns and complications associated with conscious sedation including aspiration, arrhythmia, respiratory failure and death), benefits (restoration of normal sinus rhythm) and alternatives of a direct current cardioversion were explained in detail to Mr. Arita Miss and he agrees to proceed.          Medication Adjustments/Labs and Tests Ordered: Current medicines are reviewed at length with the patient today.  Concerns regarding medicines are outlined above.  Orders Placed This Encounter  Procedures  . CT CARDIAC SCORING (SELF PAY ONLY)  . TSH  . EKG 12-Lead  . ECHOCARDIOGRAM COMPLETE   Meds ordered this encounter  Medications  . apixaban (ELIQUIS) 5 MG TABS tablet    Sig: Take 1 tablet (5 mg total) by mouth 2 (two) times daily.    Dispense:  180 tablet    Refill:  3    Patient Instructions  Medication Instructions:  Your physician has recommended you make the following change in your medication:   START Eliquis 5mg  twice daily  *If you need a refill on your cardiac medications before your next appointment, please call your pharmacy*   Lab Work: TODAY: TSH If you have labs (blood work) drawn today and your tests are completely normal, you will receive your results only by: MyChart Message (if you have MyChart) OR . A paper copy in the mail If you have any lab test that is abnormal or we need to change your treatment, we will call you to review the results.   Testing/Procedures: Your physician has requested that you have an echocardiogram. Echocardiography is a painless test that uses sound waves to create images of your heart. It provides your doctor with information about the size and shape of your heart and how well your heart's chambers and valves are working. This procedure takes approximately one hour. There are no restrictions for this procedure.  Your physician  has recommended that you have a coronary calcium score performed.    Follow-Up: At [facility], you and your health needs are our priority.  As part of our continuing mission to provide you with exceptional heart care, we have created designated Provider Care Teams.  These Care Teams include your primary Cardiologist (physician) and Advanced Practice Providers (APPs -  Physician Assistants and Nurse Practitioners) who all work together to provide you with the care you need, when you need it.   Your next appointment:   4-6 week(s)  The format for your next appointment:   In Person  Provider:   You will see one of the following Advanced Practice Providers on your designated Care Team:    [facility], PA-C  Tereso Newcomer, Chelsea Aus       Signed, [place], MD  12/20/2020 2:37 PM    [facility]

## 2020-12-20 ENCOUNTER — Other Ambulatory Visit: Payer: Self-pay

## 2020-12-20 ENCOUNTER — Encounter: Payer: Self-pay | Admitting: Cardiovascular Disease

## 2020-12-20 ENCOUNTER — Ambulatory Visit (INDEPENDENT_AMBULATORY_CARE_PROVIDER_SITE_OTHER): Payer: Medicare Other | Admitting: Cardiovascular Disease

## 2020-12-20 VITALS — BP 132/78 | HR 81 | Ht 73.5 in | Wt 237.0 lb

## 2020-12-20 DIAGNOSIS — I4819 Other persistent atrial fibrillation: Secondary | ICD-10-CM

## 2020-12-20 DIAGNOSIS — I4891 Unspecified atrial fibrillation: Secondary | ICD-10-CM | POA: Insufficient documentation

## 2020-12-20 MED ORDER — ELIQUIS 5 MG PO TABS: 5.0000 mg | ORAL_TABLET | Freq: Two times a day (BID) | ORAL | 3 refills | Status: DC

## 2020-12-20 NOTE — Patient Instructions (Addendum)
Medication Instructions:  Your physician has recommended you make the following change in your medication:   START Eliquis 5mg  twice daily  *If you need a refill on your cardiac medications before your next appointment, please call your pharmacy*   Lab Work: TODAY: TSH If you have labs (blood work) drawn today and your tests are completely normal, you will receive your results only by: MyChart Message (if you have MyChart) OR . A paper copy in the mail If you have any lab test that is abnormal or we need to change your treatment, we will call you to review the results.   Testing/Procedures: Your physician has requested that you have an echocardiogram. Echocardiography is a painless test that uses sound waves to create images of your heart. It provides your doctor with information about the size and shape of your heart and how well your heart's chambers and valves are working. This procedure takes approximately one hour. There are no restrictions for this procedure.  Your physician has recommended that you have a coronary calcium score performed.    Follow-Up: At [facility], you and your health needs are our priority.  As part of our continuing mission to provide you with exceptional heart care, we have created designated Provider Care Teams.  These Care Teams include your primary Cardiologist (physician) and Advanced Practice Providers (APPs -  Physician Assistants and Nurse Practitioners) who all work together to provide you with the care you need, when you need it.   Your next appointment:   4-6 week(s)  The format for your next appointment:   In Person  Provider:   You will see one of the following Advanced Practice Providers on your designated Care Team:    [facility], PA-C  [place], [place]

## 2020-12-21 LAB — TSH: TSH: 1.22 u[IU]/mL (ref 0.450–4.500)

## 2021-01-24 ENCOUNTER — Ambulatory Visit (INDEPENDENT_AMBULATORY_CARE_PROVIDER_SITE_OTHER)
Admission: RE | Admit: 2021-01-24 | Discharge: 2021-01-24 | Disposition: A | Discharge status: Home / Self Care | Payer: Self-pay | Source: Ambulatory Visit | Attending: Cardiovascular Disease | Admitting: Cardiovascular Disease

## 2021-01-24 ENCOUNTER — Other Ambulatory Visit: Payer: Self-pay

## 2021-01-24 ENCOUNTER — Ambulatory Visit (HOSPITAL_COMMUNITY): Payer: Medicare Other | Attending: Cardiology

## 2021-01-24 DIAGNOSIS — I4891 Unspecified atrial fibrillation: Secondary | ICD-10-CM | POA: Diagnosis not present

## 2021-01-24 LAB — ECHOCARDIOGRAM COMPLETE
Area-P 1/2: 4.21 cm2
S' Lateral: 3.3 cm

## 2021-01-31 DIAGNOSIS — R079 Chest pain, unspecified: Secondary | ICD-10-CM

## 2021-02-01 NOTE — Telephone Encounter (Signed)
Patient called to discuss results. Patient verbalized understanding and agreeable to scheduling a lexiscan. Orders entered.

## 2021-02-08 ENCOUNTER — Telehealth (HOSPITAL_COMMUNITY): Payer: Self-pay | Admitting: *Deleted

## 2021-02-08 NOTE — Telephone Encounter (Signed)
Patient given detailed instructions per Myocardial Perfusion Study Information Sheet for the test on 02/11/21 at 7:15. Patient notified to arrive 15 minutes early and that it is imperative to arrive on time for appointment to keep from having the test rescheduled.  If you need to cancel or reschedule your appointment, please call the office within 24 hours of your appointment. . Patient verbalized understanding.Timothy Cooley

## 2021-02-11 ENCOUNTER — Ambulatory Visit (HOSPITAL_COMMUNITY): Payer: Medicare Other | Attending: Cardiovascular Disease

## 2021-02-11 ENCOUNTER — Other Ambulatory Visit: Payer: Self-pay

## 2021-02-11 DIAGNOSIS — R079 Chest pain, unspecified: Secondary | ICD-10-CM | POA: Diagnosis present

## 2021-02-11 LAB — MYOCARDIAL PERFUSION IMAGING
LV dias vol: 80 mL (ref 62–150)
LV sys vol: 35 mL
Peak HR: 110 {beats}/min
Rest HR: 82 {beats}/min
SDS: 0
SRS: 0
SSS: 0
TID: 0.9

## 2021-02-11 MED ORDER — REGADENOSON 0.4 MG/5ML IV SOLN
0.4000 mg | Freq: Once | INTRAVENOUS | Status: AC
  Administered 2021-02-11: 0.4 mg via INTRAVENOUS

## 2021-02-11 MED ORDER — TECHNETIUM TC 99M TETROFOSMIN IV KIT
10.9000 | PACK | Freq: Once | INTRAVENOUS | Status: AC | PRN
Start: 2021-02-11 — End: 2021-02-11
  Administered 2021-02-11: 10.9 via INTRAVENOUS
  Filled 2021-02-11: qty 11

## 2021-02-11 MED ORDER — TECHNETIUM TC 99M TETROFOSMIN IV KIT
30.7000 | PACK | Freq: Once | INTRAVENOUS | Status: AC | PRN
Start: 2021-02-11 — End: 2021-02-11
  Administered 2021-02-11: 30.7 via INTRAVENOUS
  Filled 2021-02-11: qty 31

## 2021-03-07 NOTE — Progress Notes (Addendum)
Cardiology Office Note:    Date:  03/08/2021   ID:  Timothy Cooley, DOB February 23, 1956, MRN [id number]  PCP:  Charlane Ferretti, DO   [facility] Providers Cardiologist:  Kristeen Miss, MD     Referring MD: Charlane Ferretti, DO   Chief Complaint:  Follow-up (Atrial fibrillation, coronary calcification)    Patient Profile:    Timothy Cooley is a 65 y.o. male with:  (HFpEF) heart failure with preserved ejection fraction  Hx of pulmonary embolism  Persistent atrial fibrillation  Echocardiogram 5/22: EF 60-65 Coronary Ca2+ (CT 5/22: Ca2+ score 559 - 84th percentile) Myoview 6/22: low risk Aortic atherosclerosis  Hypertension  Hyperlipidemia  Hx of TIA in 08/2014 Pre-diabetes (A1c in 2015: 6.3)  Prior CV studies: GATED SPECT MYO PERF W/LEXISCAN STRESS 1D 02/11/2021 1.  Normal myocardial perfusion imaging study without evidence of ischemia or infarction. 2.  Normal LVEF, 57%. 3.  This is a low risk study.   Echocardiogram 01/24/21 EF 60-65, no RWMA, normal RVSF, mild LAE, mild dilation of ascending aorta (41 mm)  CAC Score 01/24/21 IMPRESSION: 1. Coronary calcium score of 559. This was 84th percentile for age-, race-, and sex-matched controls. 2.  Dilated ascending aorta measuring 52mm 3. Aortic Atherosclerosis   History of Present Illness: [name] was seen by Dr. Elease Hashimoto in 4/22.  The pt was noted to be in AFib and was started on Apixaban for anticoagulation. He had an echocardiogram that showed normal EF.  A CAC score was elevated and he was set up for a Myoview which was low risk.  He returns for f/u.  He is here with his wife.  He has overall been doing well.  He had cervical decompression surgery in January 2020.  Postop course was complicated by pulmonary embolism.  He has residual gait instability and difficulty with balance.  He does get short of breath with some activities.  He has also noted some fatigue.  He does try to walk and ride a stationary bike.  He has  not had chest pain, syncope, orthopnea    Past Medical History:  Diagnosis Date   (HFpEF) heart failure with preserved ejection fraction (HCC)    EF 60-65, no RWMA, normal RVSF, mild LAE, mild dilation of ascending aorta (41 mm)   Aortic atherosclerosis (HCC)    CT in 01/2021   Coronary artery calcification seen on CT scan    CT 5/22: Ca2+ score 559 - 84th percentile   History of TIA (transient ischemic attack) 2015   Hyperlipidemia    Hypertension    Persistent atrial fibrillation (HCC)    Prediabetes     Current Medications: Current Meds  Medication Sig   amitriptyline (ELAVIL) 25 MG tablet Take 25-50 mg by mouth at bedtime.   apixaban (ELIQUIS) 5 MG TABS tablet Take 1 tablet (5 mg total) by mouth 2 (two) times daily.   atorvastatin (LIPITOR) 40 MG tablet Take 40 mg by mouth daily.   losartan (COZAAR) 100 MG tablet Take 100 mg by mouth daily.   Multiple Vitamins-Minerals (MULTIVITAMIN MEN 50+ PO) Take 1 tablet by mouth daily.      Allergies:   Patient has no known allergies.   Social History   Tobacco Use   Smoking status: Never   Smokeless tobacco: Never  Vaping Use   Vaping Use: Never used  Substance Use Topics   Alcohol use: Yes    Alcohol/week: 14.0 standard drinks    Types: 14 Glasses of wine per week  Drug use: No     Family Hx: The patient's family history includes Heart Problems in his mother.  Review of Systems  Respiratory:  Negative for hemoptysis.   Gastrointestinal:  Negative for hematochezia and melena.  Genitourinary:  Negative for hematuria.    EKGs/Labs/Other Test Reviewed:    EKG:  EKG is  ordered today.  The ekg ordered today demonstrates atrial fibrillation, HR 96, normal axis, nonspecific ST-T wave changes, QTC 447  Recent Labs: 12/20/2020: TSH 1.220   Recent Lipid Panel Lab Results  Component Value Date/Time   CHOL 179 09/07/2014 01:09 AM   TRIG 96 09/07/2014 01:09 AM   HDL 49 09/07/2014 01:09 AM   LDLCALC 111 (H) 09/07/2014 01:09  AM      Risk Assessment/Calculations:    CHA2DS2-VASc Score = 6  This indicates a 9.7% annual risk of stroke. The patient's score is based upon: CHF History: Yes HTN History: Yes Diabetes History: No Stroke History: Yes Vascular Disease History: Yes Age Score: 1 Gender Score: 0     Physical Exam:    VS:  BP 130/80   Pulse 66   Ht 6' 1.5" (1.867 m)   Wt 234 lb 6.4 oz (106.3 kg)   SpO2 97%   BMI 30.51 kg/m     Wt Readings from Last 3 Encounters:  03/08/21 234 lb 6.4 oz (106.3 kg)  02/11/21 237 lb (107.5 kg)  12/20/20 237 lb (107.5 kg)     Constitutional:      Appearance: Healthy appearance. Not in distress.  Neck:     Vascular: No carotid bruit. JVD normal.  Pulmonary:     Effort: Pulmonary effort is normal.     Breath sounds: No wheezing. No rales.  Cardiovascular:     Normal rate. Irregularly irregular rhythm. Normal S1. Normal S2.      Murmurs: There is no murmur.  Edema:    Peripheral edema absent.  Abdominal:     Palpations: Abdomen is soft. There is no hepatomegaly.  Skin:    General: Skin is warm and dry.  Neurological:     General: No focal deficit present.     Mental Status: Alert and oriented to person, place and time.     Cranial Nerves: Cranial nerves are intact.        ASSESSMENT & PLAN:    1. Persistent atrial fibrillation (HCC) Rate is controlled.  He may be somewhat symptomatic with shortness of breath and fatigue.  It is difficult for him to tell as he has had significant issues since his neck surgery.  I reviewed physiology of atrial fibrillation with him as well as risks and benefits of cardioversion and general management of atrial fibrillation.  He has been taking Apixaban without interruption since April.  He is willing to proceed with cardioversion.  I will have him follow-up with the atrial fibrillation clinic 1 week after his procedure.  Obtain BMET, CBC today.  Follow-up with Dr. Elease Hashimoto in 6 months.  2. Coronary artery  calcification seen on CT scan He has not had anginal symptoms.  Recent Myoview was low risk.  We discussed risk factor modification to reduce risks of future events.  He does not need aspirin as he is on Apixaban.  His PCP recently increased his dose of atorvastatin.  3. Aortic atherosclerosis (HCC) As noted, he is not on aspirin as he is on Apixaban.  Continue atorvastatin.    4. Essential hypertension Somewhat borderline.  Have asked him to continue to  monitor his blood pressure at home.  If his blood pressure continues to run >130/80, consider adding beta-blocker or calcium channel blocker.  Continue current dose of losartan.  5. Pure hypercholesterolemia LDL in 10/21 was 105.  We discussed the importance of an LDL <70.  His PCP has recently increased his dose of atorvastatin and will follow up with repeat labs at some point in the near future     Shared Decision Making/Informed Consent The risks (stroke, cardiac arrhythmias rarely resulting in the need for a temporary or permanent pacemaker, skin irritation or burns and complications associated with conscious sedation including aspiration, arrhythmia, respiratory failure and death), benefits (restoration of normal sinus rhythm) and alternatives of a direct current cardioversion were explained in detail to Mr. Arita Miss and he agrees to proceed.      Dispo:  Return in about 1 week (around 03/15/2021) for Post Procedure Follow Up with AFib Clinic.   Medication Adjustments/Labs and Tests Ordered: Current medicines are reviewed at length with the patient today.  Concerns regarding medicines are outlined above.  Tests Ordered: Orders Placed This Encounter  Procedures   Basic metabolic panel   CBC   Amb Referral to AFIB Clinic   EKG 12-Lead    Medication Changes: No orders of the defined types were placed in this encounter.   Signed, Tereso Newcomer, PA-C  03/08/2021 2:45 PM    [facility] [address] [place],  [place], [place]  [address] Phone: [phone]; Fax: [phone]

## 2021-03-08 ENCOUNTER — Ambulatory Visit (INDEPENDENT_AMBULATORY_CARE_PROVIDER_SITE_OTHER): Payer: Medicare Other | Admitting: Physician Assistant

## 2021-03-08 ENCOUNTER — Other Ambulatory Visit: Payer: Self-pay

## 2021-03-08 ENCOUNTER — Encounter: Payer: Self-pay | Admitting: *Deleted

## 2021-03-08 ENCOUNTER — Encounter: Payer: Self-pay | Admitting: Physician Assistant

## 2021-03-08 VITALS — BP 130/80 | HR 66 | Ht 73.5 in | Wt 234.4 lb

## 2021-03-08 DIAGNOSIS — I2510 Atherosclerotic heart disease of native coronary artery without angina pectoris: Secondary | ICD-10-CM

## 2021-03-08 DIAGNOSIS — E7800 Pure hypercholesterolemia, unspecified: Secondary | ICD-10-CM

## 2021-03-08 DIAGNOSIS — I10 Essential (primary) hypertension: Secondary | ICD-10-CM | POA: Diagnosis not present

## 2021-03-08 DIAGNOSIS — I4819 Other persistent atrial fibrillation: Secondary | ICD-10-CM

## 2021-03-08 DIAGNOSIS — I5030 Unspecified diastolic (congestive) heart failure: Secondary | ICD-10-CM | POA: Insufficient documentation

## 2021-03-08 DIAGNOSIS — I700 Atherosclerosis of aorta: Secondary | ICD-10-CM

## 2021-03-08 NOTE — Patient Instructions (Signed)
Medication Instructions:  No changes in medications were made today If you need a refill on your cardiac medications before your next appointment, please call your pharmacy   Lab Work: Today BMET, CBC If you have labs (blood work) drawn today and your tests are completely normal, you will receive your results only by: MyChart Message (if you have MyChart) OR A paper copy in the mail If you have any lab test that is abnormal or we need to change your treatment, we will call you to review the results.   Testing/Procedures: Your physician has recommended that you have a Cardioversion (DCCV). Electrical Cardioversion uses a jolt of electricity to your heart either through paddles or wired patches attached to your chest. This is a controlled, usually prescheduled, procedure. Defibrillation is done under light anesthesia in the hospital, and you usually go home the day of the procedure. This is done to get your heart back into a normal rhythm. You are not awake for the procedure. Please see the instruction sheet given to you today. YOU HAVE BEEN SCHEDULED FOR A CARDIOVERSION TO BE DONE ON 03/11/21 @ 11:45    Follow-Up: At [facility], you and your health needs are our priority.  As part of our continuing mission to provide you with exceptional heart care, we have created designated Provider Care Teams.  These Care Teams include your primary Cardiologist (physician) and Advanced Practice Providers (APPs -  Physician Assistants and Nurse Practitioners) who all work together to provide you with the care you need, when you need it.  We recommend signing up for the patient portal called "MyChart".  Sign up information is provided on this After Visit Summary.  MyChart is used to connect with patients for Virtual Visits (Telemedicine).  Patients are able to view lab/test results, encounter notes, upcoming appointments, etc.  Non-urgent messages can be sent to your provider as well.   To learn more about  what you can do with MyChart, go to [email or web address].    Your next appointment:    6 MONTH FOLLOW UP WITH DR. Elease Hashimoto OR CARE TEAM  YOUR ARE BEING REFERRED TO THE A-FIB CLINIC TO BE SEEN 1 WEEK POST CARDIOVERSION; THEY WILL CALL YOU WITH AN APPT.   The format for your next appointment:   In Person  Provider:   You may see Kristeen Miss, MD or one of the following Advanced Practice Providers on your designated Care Team:   Tereso Newcomer, PA-C [place], [place] DR. Elease Hashimoto OR CARE TEAM   Other Instructions   Dear  (patient name) You are scheduled for a Cardioversion on 03/11/21 @ 11:45 AM with Dr. Maisie Fus O'NEAL.  Please arrive at the [facility] (Main Entrance A) at [facility]: [address] [place], [place] [address] at 10:45 am. (1 hour prior to procedure unless lab work is needed; if lab work is needed arrive 1.5 hours ahead)  DIET: Nothing to eat or drink after midnight except a sip of water with medications (see medication instructions below)  FYI: For your safety, and to allow [place] to monitor your vital signs accurately during the surgery/procedure we request that   if you have artificial nails, gel coating, SNS etc. Please have those removed prior to your surgery/procedure. Not having the nail coverings /polish removed may result in cancellation or delay of your surgery/procedure.   Medication Instructions:  Continue your anticoagulant: ELIQUIS You will need to continue your anticoagulant after your procedure until you  are told by your  Provider that it is safe to stop   Labs: If patient is on Coumadin, patient needs pt/INR, CBC, BMET within 3 days (No pt/INR needed for patients taking Xarelto, Eliquis, Pradaxa) For patients receiving anesthesia for TEE and all Cardioversion patients: BMET, CBC within 1 week  Come to: LAB WORK TODAY (Lab option #1) Come to the lab at [facility] [facility] between the hours of 8:00 am and 4:30 pm. You do not have to be  fasting. (Lab option #2) Lab at an alternate location (Lab option #3) your lab work will be done at the hospital prior to your procedure - you will need to arrive 1  hours ahead of your procedure  You must have a responsible person to drive you home and stay in the waiting area during your procedure. Failure to do so could result in cancellation.  Bring your insurance cards.  *Special Note: Every effort is made to have your procedure done on time. Occasionally there are emergencies that occur at the hospital that may cause delays. Please be patient if a delay does occur

## 2021-03-09 LAB — CBC
Hematocrit: 46.2 % (ref 37.5–51.0)
Hemoglobin: 15.4 g/dL (ref 13.0–17.7)
MCH: 29.4 pg (ref 26.6–33.0)
MCHC: 33.3 g/dL (ref 31.5–35.7)
MCV: 88 fL (ref 79–97)
Platelets: 213 10*3/uL (ref 150–450)
RBC: 5.24 x10E6/uL (ref 4.14–5.80)
RDW: 13.5 % (ref 11.6–15.4)
WBC: 7.8 10*3/uL (ref 3.4–10.8)

## 2021-03-09 LAB — BASIC METABOLIC PANEL
BUN/Creatinine Ratio: 16 (ref 10–24)
BUN: 18 mg/dL (ref 8–27)
CO2: 21 mmol/L (ref 20–29)
Calcium: 9.8 mg/dL (ref 8.6–10.2)
Chloride: 103 mmol/L (ref 96–106)
Creatinine, Ser: 1.15 mg/dL (ref 0.76–1.27)
Glucose: 112 mg/dL — ABNORMAL HIGH (ref 65–99)
Potassium: 5 mmol/L (ref 3.5–5.2)
Sodium: 140 mmol/L (ref 134–144)
eGFR: 71 mL/min/{1.73_m2} (ref >59)

## 2021-03-11 ENCOUNTER — Other Ambulatory Visit: Payer: Self-pay

## 2021-03-11 ENCOUNTER — Ambulatory Visit (HOSPITAL_COMMUNITY): Payer: Medicare Other | Admitting: Anesthesiology

## 2021-03-11 ENCOUNTER — Encounter (HOSPITAL_COMMUNITY): Payer: Self-pay | Admitting: Cardiovascular Disease

## 2021-03-11 ENCOUNTER — Encounter (HOSPITAL_COMMUNITY)
Admission: RE | Disposition: A | Discharge status: Home / Self Care | Payer: Self-pay | Source: Home / Self Care | Attending: Cardiovascular Disease

## 2021-03-11 ENCOUNTER — Ambulatory Visit (HOSPITAL_COMMUNITY)
Admission: RE | Admit: 2021-03-11 | Discharge: 2021-03-11 | Disposition: A | Discharge status: Home / Self Care | Payer: Medicare Other | Attending: Cardiovascular Disease | Admitting: Cardiovascular Disease

## 2021-03-11 DIAGNOSIS — I4891 Unspecified atrial fibrillation: Secondary | ICD-10-CM | POA: Insufficient documentation

## 2021-03-11 DIAGNOSIS — Z7901 Long term (current) use of anticoagulants: Secondary | ICD-10-CM | POA: Diagnosis not present

## 2021-03-11 DIAGNOSIS — I4819 Other persistent atrial fibrillation: Secondary | ICD-10-CM

## 2021-03-11 DIAGNOSIS — Z79899 Other long term (current) drug therapy: Secondary | ICD-10-CM | POA: Diagnosis not present

## 2021-03-11 HISTORY — PX: CARDIOVERSION: SHX1299

## 2021-03-11 SURGERY — CARDIOVERSION
Anesthesia: Monitor Anesthesia Care

## 2021-03-11 MED ORDER — PROPOFOL 10 MG/ML IV BOLUS
INTRAVENOUS | Status: DC | PRN
  Administered 2021-03-11: 70 mg via INTRAVENOUS
  Administered 2021-03-11: 20 mg via INTRAVENOUS

## 2021-03-11 MED ORDER — SODIUM CHLORIDE 0.9 % IV SOLN: INTRAVENOUS | Status: DC | PRN

## 2021-03-11 MED ORDER — LIDOCAINE 2% (20 MG/ML) 5 ML SYRINGE
INTRAMUSCULAR | Status: DC | PRN
  Administered 2021-03-11: 50 mg via INTRAVENOUS

## 2021-03-11 MED ORDER — HYDROCORTISONE 1 % EX CREA: 1.0000 "application " | TOPICAL_CREAM | Freq: Three times a day (TID) | CUTANEOUS | Status: DC | PRN

## 2021-03-11 NOTE — Anesthesia Procedure Notes (Signed)
Procedure Name: General with mask airway Date/Time: 03/11/2021 1:00 PM Performed by: Lovie Chol, CRNA Pre-anesthesia Checklist: Patient identified, Emergency Drugs available, Suction available and Patient being monitored Patient Re-evaluated:Patient Re-evaluated prior to induction Oxygen Delivery Method: Ambu bag

## 2021-03-11 NOTE — Transfer of Care (Signed)
Immediate Anesthesia Transfer of Care Note  Patient: Timothy Cooley  Procedure(s) Performed: CARDIOVERSION  Patient Location: Endoscopy Unit  Anesthesia Type:General  Level of Consciousness: awake, oriented and patient cooperative  Airway & Oxygen Therapy: Patient Spontanous Breathing and Patient connected to nasal cannula oxygen  Post-op Assessment: Report given to RN and Post -op Vital signs reviewed and stable  Post vital signs: Reviewed  Last Vitals:  Vitals Value Taken Time  BP 157/102 03/11/21 1307  Temp    Pulse 78 03/11/21 1309  Resp 15 03/11/21 1309  SpO2 100 % 03/11/21 1309    Last Pain:  Vitals:   03/11/21 1207  TempSrc: Oral  PainSc: 0-No pain         Complications: No notable events documented.

## 2021-03-11 NOTE — Discharge Instructions (Signed)

## 2021-03-11 NOTE — Anesthesia Preprocedure Evaluation (Addendum)
Anesthesia Evaluation  Patient identified by MRN, date of birth, ID band Patient awake    Reviewed: Allergy & Precautions, NPO status , Patient's Chart, lab work & pertinent test results  Airway Mallampati: II  TM Distance: >3 FB Neck ROM: Full    Dental  (+) Teeth Intact, Dental Advisory Given   Pulmonary neg pulmonary ROS,    Pulmonary exam normal breath sounds clear to auscultation       Cardiovascular hypertension, + CAD and +CHF  Normal cardiovascular exam+ dysrhythmias (on eliquis) Atrial Fibrillation  Rhythm:Irregular Rate:Normal  TTE 2022 1. Left ventricular ejection fraction, by estimation, is 60 to 65%. The  left ventricle has normal function. The left ventricle has no regional  wall motion abnormalities. Left ventricular diastolic parameters are  indeterminate.  2. Right ventricular systolic function is normal. The right ventricular  size is mildly enlarged.  3. Left atrial size was mildly dilated.  4. The mitral valve is normal in structure. No evidence of mitral valve  regurgitation. No evidence of mitral stenosis.  5. The aortic valve is normal in structure. Aortic valve regurgitation is  not visualized. No aortic stenosis is present.  6. Aortic dilatation noted. There is mild dilatation of the ascending  aorta, measuring 41 mm.  7. The inferior vena cava is normal in size with greater than 50%  respiratory variability, suggesting right atrial pressure of 3 mmHg   Neuro/Psych TIAnegative psych ROS   GI/Hepatic negative GI ROS, Neg liver ROS,   Endo/Other  negative endocrine ROS  Renal/GU negative Renal ROS  negative genitourinary   Musculoskeletal negative musculoskeletal ROS (+)   Abdominal   Peds  Hematology negative hematology ROS (+)   Anesthesia Other Findings   Reproductive/Obstetrics                            Anesthesia Physical Anesthesia Plan  ASA:  3  Anesthesia Plan: MAC   Post-op Pain Management:    Induction: Intravenous  PONV Risk Score and Plan: Propofol infusion and Treatment may vary due to age or medical condition  Airway Management Planned: Natural Airway  Additional Equipment:   Intra-op Plan:   Post-operative Plan:   Informed Consent: I have reviewed the patients History and Physical, chart, labs and discussed the procedure including the risks, benefits and alternatives for the proposed anesthesia with the patient or authorized representative who has indicated his/her understanding and acceptance.     Dental advisory given  Plan Discussed with: CRNA  Anesthesia Plan Comments:         Anesthesia Quick Evaluation

## 2021-03-11 NOTE — Anesthesia Postprocedure Evaluation (Signed)
Anesthesia Post Note  Patient: Timothy Cooley  Procedure(s) Performed: CARDIOVERSION     Patient location during evaluation: Endoscopy Anesthesia Type: MAC Level of consciousness: awake and alert Pain management: pain level controlled Vital Signs Assessment: post-procedure vital signs reviewed and stable Respiratory status: spontaneous breathing, nonlabored ventilation, respiratory function stable and patient connected to nasal cannula oxygen Cardiovascular status: blood pressure returned to baseline and stable Postop Assessment: no apparent nausea or vomiting Anesthetic complications: no   No notable events documented.  Last Vitals:  Vitals:   03/11/21 1314 03/11/21 1324  BP: 138/88 (!) 135/101  Pulse: 77 76  Resp: 12 16  Temp: 36.6 C   SpO2: 100% 100%    Last Pain:  Vitals:   03/11/21 1324  TempSrc:   PainSc: 0-No pain                 Chau Sawin L Shanan Mcmiller

## 2021-03-11 NOTE — CV Procedure (Signed)
   DIRECT CURRENT CARDIOVERSION  NAME:  Timothy Cooley    MRN: [id number] DOB:  1955/09/15    ADMIT DATE: 03/11/2021  Indication:  Symptomatic atrial fibrillation  Procedure Note:  The patient signed informed consent.  They have had had therapeutic anticoagulation with eliquis greater than 3 weeks.  Anesthesia was administered by Dr. Armond Hang.  Adequate airway was maintained throughout and vital followed per protocol.  They were cardioverted x 1 with 200J of biphasic synchronized energy.  They converted to NSR.  There were no apparent complications.  The patient had normal neuro status and respiratory status post procedure with vitals stable as recorded elsewhere.    Follow up: They will continue on current medical therapy and follow up with cardiology as scheduled.  Gerri Spore T. Flora Lipps, MD, [facility]  [facility]  [address] [place], [place] [address] [phone]  1:06 PM

## 2021-03-11 NOTE — H&P (Signed)
Cardiology Admission History and Physical:  Patient ID: Timothy Cooley MRN: [id number] DOB: [birth year]  Admit date: 03/11/2021  Primary Care Provider: Charlane Ferretti, DO Primary Cardiologist: Kristeen Miss, MD  Primary Electrophysiologist:  None   Chief Complaint:  Atrial fibrillation  Patient Profile:  Timothy Cooley is a 65 y.o. male with Atrial fibrillation. Here for elective cardioversion. On eliquis >3 weeks. No missed doses. Refer to Clinic note from [facility] for further details.    Heart Pathway Score:       Past Medical History: Past Medical History:  Diagnosis Date   (HFpEF) heart failure with preserved ejection fraction (HCC)    EF 60-65, no RWMA, normal RVSF, mild LAE, mild dilation of ascending aorta (41 mm)   Aortic atherosclerosis (HCC)    CT in 01/2021   Coronary artery calcification seen on CT scan    CT 5/22: Ca2+ score 559 - 84th percentile   History of TIA (transient ischemic attack) 2015   Hyperlipidemia    Hypertension    Persistent atrial fibrillation (HCC)    Prediabetes     Past Surgical History: Past Surgical History:  Procedure Laterality Date   APPLICATION OF ROBOTIC ASSISTANCE FOR SPINAL PROCEDURE N/A 10/08/2018   Procedure: APPLICATION OF ROBOTIC ASSISTANCE FOR SPINAL PROCEDURE;  Surgeon: Barnett Abu, MD;  Location: [facility] OR;  Service: Neurosurgery;  Laterality: N/A;   NO PAST SURGERIES     none     POSTERIOR CERVICAL FUSION/FORAMINOTOMY N/A 10/08/2018   Procedure: Cervical Seven to Thoracic One laminectomy/foraminotomy with posterior fusion Cervical Seven to Thoracic One using Mazor;  Surgeon: Barnett Abu, MD;  Location: [facility] OR;  Service: Neurosurgery;  Laterality: N/A;  Cervical Seven to Thoracic One laminectomy/foraminotomy with posterior fusion Cervical Seven to Thoracic One using Mazor     Medications Prior to Admission: Prior to Admission medications   Medication Sig Start Date End Date Taking? Authorizing Provider   amitriptyline (ELAVIL) 25 MG tablet Take 50 mg by mouth at bedtime. 01/24/21  Yes [provider]  apixaban (ELIQUIS) 5 MG TABS tablet Take 1 tablet (5 mg total) by mouth 2 (two) times daily. 12/20/20  Yes Nahser, Deloris Ping, MD  atorvastatin (LIPITOR) 40 MG tablet Take 40 mg by mouth in the morning. 01/27/21  Yes [provider]  losartan (COZAAR) 100 MG tablet Take 100 mg by mouth in the morning. 08/23/18  Yes [provider]  Multiple Vitamin (MULTIVITAMIN WITH MINERALS) TABS tablet Take 1 tablet by mouth daily. Adult 50+   Yes [provider]     Allergies:    No Known Allergies  Social History:   Social History   Socioeconomic History   Marital status: Married    Spouse name: Not on file   Number of children: Not on file   Years of education: Not on file   Highest education level: Not on file  Occupational History   Not on file  Tobacco Use   Smoking status: Never   Smokeless tobacco: Never  Vaping Use   Vaping Use: Never used  Substance and Sexual Activity   Alcohol use: Yes    Alcohol/week: 14.0 standard drinks    Types: 14 Glasses of wine per week   Drug use: No   Sexual activity: Yes  Other Topics Concern   Not on file  Social History Narrative   Not on file   Social Determinants of Health   Financial Resource Strain: Not on file  Food Insecurity: Not on  file  Transportation Needs: Not on file  Physical Activity: Not on file  Stress: Not on file  Social Connections: Not on file  Intimate Partner Violence: Not on file     Family History:   The patient's family history includes Heart Problems in his mother.    ROS:  All other ROS reviewed and negative. Pertinent positives noted in the HPI.     Physical Exam/Data:   Vitals:   03/11/21 1207  BP: (!) 132/92  Pulse: 85  Resp: 15  Temp: 97.9 F (36.6 C)  TempSrc: Oral  SpO2: 99%  Weight: 106 kg  Height: 6\' 2"  (1.88 m)   No intake or output data in the 24 hours  ending 03/11/21 1215  Last 3 Weights 03/11/2021 03/08/2021 02/11/2021  Weight (lbs) 233 lb 11 oz 234 lb 6.4 oz 237 lb  Weight (kg) 106 kg 106.323 kg 107.502 kg    Body mass index is 30 kg/m.  General: Well nourished, well developed, in no acute distress Head: Atraumatic, normal size  Eyes: PEERLA, EOMI  Neck: Supple, no JVD Endocrine: No thryomegaly Cardiac: Normal S1, S2; RRR; no murmurs, rubs, or gallops Lungs: Clear to auscultation bilaterally, no wheezing, rhonchi or rales  Abd: Soft, nontender, no hepatomegaly  Ext: No edema, pulses 2+ Musculoskeletal: No deformities, BUE and BLE strength normal and equal Skin: Warm and dry, no rashes   Neuro: Alert and oriented to person, place, time, and situation, CNII-XII grossly intact, no focal deficits  Psych: Normal mood and affect   Laboratory Data: High Sensitivity Troponin:  No results for input(s): TROPONINIHS in the last 720 hours.    Cardiac EnzymesNo results for input(s): TROPONINI in the last 168 hours. No results for input(s): TROPIPOC in the last 168 hours.  Chemistry Recent Labs  Lab 03/08/21 1508  NA 140  K 5.0  CL 103  CO2 21  GLUCOSE 112*  BUN 18  CREATININE 1.15  CALCIUM 9.8    No results for input(s): PROT, ALBUMIN, AST, ALT, ALKPHOS, BILITOT in the last 168 hours. Hematology Recent Labs  Lab 03/08/21 1508  WBC 7.8  RBC 5.24  HGB 15.4  HCT 46.2  MCV 88  MCH 29.4  MCHC 33.3  RDW 13.5  PLT 213   BNPNo results for input(s): BNP, PROBNP in the last 168 hours.  DDimer No results for input(s): DDIMER in the last 168 hours.  Radiology/Studies:  No results found.  Assessment and Plan:  Afib. NPO for DCCV. On eliquis. No missed doses.    Signed, 03/10/21. Lenna Gilford, MD, [facility]  [facility]  03/11/2021 12:15 PM

## 2021-03-23 ENCOUNTER — Encounter (HOSPITAL_COMMUNITY): Payer: Self-pay | Admitting: Physician Assistant

## 2021-03-23 ENCOUNTER — Other Ambulatory Visit: Payer: Self-pay

## 2021-03-23 ENCOUNTER — Encounter: Payer: Self-pay | Admitting: *Deleted

## 2021-03-23 ENCOUNTER — Ambulatory Visit (HOSPITAL_COMMUNITY)
Admission: RE | Admit: 2021-03-23 | Discharge: 2021-03-23 | Disposition: A | Discharge status: Home / Self Care | Payer: Medicare Other | Source: Ambulatory Visit | Attending: Physician Assistant | Admitting: Physician Assistant

## 2021-03-23 VITALS — BP 122/82 | HR 80 | Ht 74.0 in | Wt 235.8 lb

## 2021-03-23 DIAGNOSIS — E669 Obesity, unspecified: Secondary | ICD-10-CM | POA: Diagnosis not present

## 2021-03-23 DIAGNOSIS — Z6830 Body mass index (BMI) 30.0-30.9, adult: Secondary | ICD-10-CM | POA: Diagnosis not present

## 2021-03-23 DIAGNOSIS — I700 Atherosclerosis of aorta: Secondary | ICD-10-CM | POA: Insufficient documentation

## 2021-03-23 DIAGNOSIS — Z7901 Long term (current) use of anticoagulants: Secondary | ICD-10-CM | POA: Diagnosis not present

## 2021-03-23 DIAGNOSIS — I2510 Atherosclerotic heart disease of native coronary artery without angina pectoris: Secondary | ICD-10-CM | POA: Diagnosis not present

## 2021-03-23 DIAGNOSIS — I110 Hypertensive heart disease with heart failure: Secondary | ICD-10-CM | POA: Diagnosis not present

## 2021-03-23 DIAGNOSIS — I4819 Other persistent atrial fibrillation: Secondary | ICD-10-CM | POA: Diagnosis present

## 2021-03-23 DIAGNOSIS — E785 Hyperlipidemia, unspecified: Secondary | ICD-10-CM | POA: Insufficient documentation

## 2021-03-23 DIAGNOSIS — D6869 Other thrombophilia: Secondary | ICD-10-CM | POA: Insufficient documentation

## 2021-03-23 DIAGNOSIS — I5032 Chronic diastolic (congestive) heart failure: Secondary | ICD-10-CM | POA: Insufficient documentation

## 2021-03-23 NOTE — Progress Notes (Signed)
Primary Care Physician: Charlane Ferretti, DO Primary Cardiologist: Dr Elease Hashimoto Primary Electrophysiologist: none Referring Physician: Tereso Newcomer PA   Sumner Carreker II is a 65 y.o. male with a history of prior TIA, prior PE, chronic diastolic CHF, HTN, HLD, CAD, and atrial fibrillation who presents for consultation in the [facility].  The patient was initially diagnosed with atrial fibrillation 12/20/20 after presenting to Dr Harvie Bridge office for routine follow up. It was unclear how symptomatic he was but he did have some dyspnea with activity and fatigue. Patient is on Eliquis for a CHADS2VASC score of 6. He underwent DCCV on 03/11/21. Unfortunately, he was back out of rhythm 4-5 days later, confirmed by his smart watch. He did not feel much different in SR. He denies any bleeding issues on anticoagulation. He does drink 2 glasses of wine with dinner.  Today, he denies symptoms of palpitations, chest pain, shortness of breath, orthopnea, PND, lower extremity edema, dizziness, presyncope, syncope, snoring, daytime somnolence, bleeding, or neurologic sequela. The patient is tolerating medications without difficulties and is otherwise without complaint today.    Atrial Fibrillation Risk Factors:  he does not have symptoms or diagnosis of sleep apnea. he does not have a history of rheumatic fever. he does have a history of alcohol use. The patient does not have a history of early familial atrial fibrillation or other arrhythmias.  he has a BMI of Body mass index is 30.27 kg/m.Marland Kitchen Filed Weights   03/23/21 1340  Weight: 107 kg    Family History  Problem Relation Age of Onset   Heart Problems Mother      Atrial Fibrillation Management history:  Previous antiarrhythmic drugs: none Previous cardioversions: 03/11/21 Previous ablations: none CHADS2VASC score: 6 Anticoagulation history: Eliquis   Past Medical History:  Diagnosis Date   (HFpEF) heart failure  with preserved ejection fraction (HCC)    EF 60-65, no RWMA, normal RVSF, mild LAE, mild dilation of ascending aorta (41 mm)   Aortic atherosclerosis (HCC)    CT in 01/2021   Coronary artery calcification seen on CT scan    CT 5/22: Ca2+ score 559 - 84th percentile   History of TIA (transient ischemic attack) 2015   Hyperlipidemia    Hypertension    Persistent atrial fibrillation (HCC)    Prediabetes    Past Surgical History:  Procedure Laterality Date   APPLICATION OF ROBOTIC ASSISTANCE FOR SPINAL PROCEDURE N/A 10/08/2018   Procedure: APPLICATION OF ROBOTIC ASSISTANCE FOR SPINAL PROCEDURE;  Surgeon: Barnett Abu, MD;  Location: [facility] OR;  Service: Neurosurgery;  Laterality: N/A;   CARDIOVERSION N/A 03/11/2021   Procedure: CARDIOVERSION;  Surgeon: Sande Rives, MD;  Location: [facility] ENDOSCOPY;  Service: Cardiovascular;  Laterality: N/A;   NO PAST SURGERIES     none     POSTERIOR CERVICAL FUSION/FORAMINOTOMY N/A 10/08/2018   Procedure: Cervical Seven to Thoracic One laminectomy/foraminotomy with posterior fusion Cervical Seven to Thoracic One using Mazor;  Surgeon: Barnett Abu, MD;  Location: [facility] [place] OR;  Service: Neurosurgery;  Laterality: N/A;  Cervical Seven to Thoracic One laminectomy/foraminotomy with posterior fusion Cervical Seven to Thoracic One using Mazor    Current Outpatient Medications  Medication Sig Dispense Refill   amitriptyline (ELAVIL) 25 MG tablet Take 50 mg by mouth at bedtime.     apixaban (ELIQUIS) 5 MG TABS tablet Take 1 tablet (5 mg total) by mouth 2 (two) times daily. 180 tablet 3   atorvastatin (LIPITOR) 40 MG tablet Take 40 mg by  mouth in the morning.     Docusate Sodium (DULCOLAX STOOL SOFTENER PO) Take by mouth.     losartan (COZAAR) 100 MG tablet Take 100 mg by mouth in the morning.     Multiple Vitamin (MULTIVITAMIN WITH MINERALS) TABS tablet Take 1 tablet by mouth daily. Adult 50+     No current facility-administered medications for this encounter.    No  Known Allergies  Social History   Socioeconomic History   Marital status: Married    Spouse name: Not on file   Number of children: Not on file   Years of education: Not on file   Highest education level: Not on file  Occupational History   Not on file  Tobacco Use   Smoking status: Never   Smokeless tobacco: Never  Vaping Use   Vaping Use: Never used  Substance and Sexual Activity   Alcohol use: Yes    Alcohol/week: 14.0 standard drinks    Types: 14 Glasses of wine per week    Comment: 2 glasses daily   Drug use: No   Sexual activity: Yes  Other Topics Concern   Not on file  Social History Narrative   Not on file   Social Determinants of Health   Financial Resource Strain: Not on file  Food Insecurity: Not on file  Transportation Needs: Not on file  Physical Activity: Not on file  Stress: Not on file  Social Connections: Not on file  Intimate Partner Violence: Not on file     ROS- All systems are reviewed and negative except as per the HPI above.  Physical Exam: Vitals:   03/23/21 1340  BP: 122/82  Pulse: 80  Weight: 107 kg  Height: 6\' 2"  (1.88 m)    GEN- The patient is a well appearing obese male, alert and oriented x 3 today.   Head- normocephalic, atraumatic Eyes-  Sclera clear, conjunctiva pink Ears- hearing intact Oropharynx- clear Neck- supple  Lungs- Clear to ausculation bilaterally, normal work of breathing Heart- irregular rate and rhythm, no murmurs, rubs or gallops  GI- soft, NT, ND, + BS Extremities- no clubbing, cyanosis, or edema MS- no significant deformity or atrophy Skin- no rash or lesion Psych- euthymic mood, full affect Neuro- strength and sensation are intact  Wt Readings from Last 3 Encounters:  03/23/21 107 kg  03/11/21 106 kg  03/08/21 106.3 kg    EKG today demonstrates  Afib Vent. rate 80 BPM PR interval * ms QRS duration 86 ms QT/QTcB 382/440 ms  Echo 01/24/21 demonstrated  1. Left ventricular ejection  fraction, by estimation, is 60 to 65%. The  left ventricle has normal function. The left ventricle has no regional  wall motion abnormalities. Left ventricular diastolic parameters are  indeterminate.   2. Right ventricular systolic function is normal. The right ventricular  size is mildly enlarged.   3. Left atrial size was mildly dilated.   4. The mitral valve is normal in structure. No evidence of mitral valve  regurgitation. No evidence of mitral stenosis.   5. The aortic valve is normal in structure. Aortic valve regurgitation is  not visualized. No aortic stenosis is present.   6. Aortic dilatation noted. There is mild dilatation of the ascending  aorta, measuring 41 mm.   7. The inferior vena cava is normal in size with greater than 50%  respiratory variability, suggesting right atrial pressure of 3 mmHg.   Comparison(s): No significant change from prior study. Prior images  reviewed side by  side.   Epic records are reviewed at length today  CHA2DS2-VASc Score = 6  The patient's score is based upon: CHF History: Yes HTN History: Yes Diabetes History: No Stroke History: Yes Vascular Disease History: Yes Age Score: 1 Gender Score: 0      ASSESSMENT AND PLAN: 1. Persistent Atrial Fibrillation (ICD10:  I48.19) The patient's CHA2DS2-VASc score is 6, indicating a 9.7% annual risk of stroke.   S/p DCCV on 03/11/21 with early return of afib. We discussed therapeutic options today including AAD vs ablation vs rate control. Given his young age, would not favor rate control. Would avoid class IC with CAD. Could consider Multaq or dofetilide but he would need to stop amitriptyline. He and his wife decline to make any changes at this time and would like to speak with Dr Elease Hashimoto before making any changes. Will see if his appt can be moved up. We discussed that the longer he is in afib the harder it may be to restore SR. Patient voices understanding.  Continue Eliquis 5 mg BID  2.  Secondary Hypercoagulable State (ICD10:  D68.69) The patient is at significant risk for stroke/thromboembolism based upon his CHA2DS2-VASc Score of 6.  Continue Apixaban (Eliquis).   3. Obesity Body mass index is 30.27 kg/m. Lifestyle modification was discussed at length including regular exercise and weight reduction.  4. CAD/aortic atherosclerosis No anginal symptoms.    5. HTN Stable, no changes today.   Follow up with Dr Elease Hashimoto. AF clinic pending decision regarding rhythm strategy.    [name] PA-C Afib Clinic [facility] [address] [place], [place] [address] [phone] 03/23/2021 1:47 PM

## 2021-06-14 ENCOUNTER — Ambulatory Visit: Payer: Medicare Other | Admitting: Cardiovascular Disease

## 2021-08-15 ENCOUNTER — Ambulatory Visit: Payer: Medicare Other | Admitting: Cardiovascular Disease

## 2021-09-15 DIAGNOSIS — M7021 Olecranon bursitis, right elbow: Secondary | ICD-10-CM | POA: Diagnosis not present

## 2021-09-16 DIAGNOSIS — I4891 Unspecified atrial fibrillation: Secondary | ICD-10-CM | POA: Diagnosis not present

## 2021-09-16 DIAGNOSIS — M542 Cervicalgia: Secondary | ICD-10-CM | POA: Diagnosis not present

## 2021-09-16 DIAGNOSIS — M4713 Other spondylosis with myelopathy, cervicothoracic region: Secondary | ICD-10-CM | POA: Diagnosis not present

## 2021-09-18 ENCOUNTER — Encounter: Payer: Self-pay | Admitting: Cardiovascular Disease

## 2021-09-18 NOTE — Progress Notes (Signed)
Cardiology Office Note:    Date:  09/19/2021   ID:  Timothy Cooley, DOB 09/27/1955, MRN [id number]  PCP:  Sueanne Margarita, [place]  Cardiologist:  Izel Eisenhardt  Advanced Practice Provider:  No care team member to display Electrophysiologist:  None    :[id number]   Referring MD: Sueanne Margarita, DO   Chief Complaint  Patient presents with   Atrial Fibrillation        pulmonary embolus   Hyperlipidemia    December 20, 2020   Timothy Cooley is a 66 y.o. male with a hx of  Pulmonary embolus, HTN I saw Timothy Cooley vis telemedicine visit in 2020  Hx of PE, chronic diastolic CHF, HTN, HLD  I saw him in June 2020 He had neck surgery in Jan. 2016  He was called to come back in for follow up visit  He is no longer on Eliquis.   ECG shows Afib with controlled Rate. Alcohol - 2 glasses of wine with dinner .  No OSA symptoms   Has numbness from his chest down due to a spinal cord.   Jan. 9, 2023: Timothy Cooley is seen today for follow up of his atrial fib Hx of PE, chronic diastlic CHF, HTN, HLD  He had DC cardioversion in July, 2022 - had early return of his Afib.  Went to Afib clinic   Has been seen by [facility] cardiology and [facility] EP Was offered Afib ablation.  Working out regularly .   Spin cycle Is in persistent Afib .  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 He is at low risk for CV complication with colonoscopy  He may hold his eliquis for 2 days prior to procedure    Past Medical History:  Diagnosis Date   (HFpEF) heart failure with preserved ejection fraction (HCC)    EF 60-65, no RWMA, normal RVSF, mild LAE, mild dilation of ascending aorta (41 mm)   Aortic atherosclerosis (HCC)    CT in 01/2021   Coronary artery calcification seen on CT scan    CT 5/22: Ca2+ score 559 - 84th percentile   History of TIA (transient ischemic attack) 2015   Hyperlipidemia    Hypertension    Persistent atrial  fibrillation ([place])    Prediabetes     Past Surgical History:  Procedure Laterality Date   APPLICATION OF ROBOTIC ASSISTANCE FOR SPINAL PROCEDURE N/A 10/08/2018   Procedure: APPLICATION OF ROBOTIC ASSISTANCE FOR SPINAL PROCEDURE;  Surgeon: Kristeen Miss, MD;  Location: [place];  Service: Neurosurgery;  Laterality: N/A;   CARDIOVERSION N/A 03/11/2021   Procedure: CARDIOVERSION;  Surgeon: Geralynn Rile, MD;  Location: [place];  Service: Cardiovascular;  Laterality: N/A;   NO PAST SURGERIES     none     POSTERIOR CERVICAL FUSION/FORAMINOTOMY N/A 10/08/2018   Procedure: Cervical Seven to Thoracic One laminectomy/foraminotomy with posterior fusion Cervical Seven to Thoracic One using Mazor;  Surgeon: Kristeen Miss, MD;  Location: [place];  Service: Neurosurgery;  Laterality: N/A;  Cervical Seven to Thoracic One laminectomy/foraminotomy with posterior fusion Cervical Seven to Thoracic One using Mazor    Current Medications: Current Meds  Medication Sig   amitriptyline (ELAVIL) 25 MG tablet Take 50 mg by mouth at bedtime.   amoxicillin (AMOXIL) 500 MG capsule Take 500 mg by mouth 3 (three) times daily.   apixaban (ELIQUIS) 5 MG TABS tablet Take 1 tablet (5 mg total) by mouth  2 (two) times daily.   atorvastatin (LIPITOR) 40 MG tablet Take 40 mg by mouth in the morning.   Docusate Sodium (DULCOLAX STOOL SOFTENER PO) Take by mouth.   losartan (COZAAR) 100 MG tablet Take 100 mg by mouth in the morning.   Multiple Vitamin (MULTIVITAMIN WITH MINERALS) TABS tablet Take 1 tablet by mouth daily. Adult 50+     Allergies:   Patient has no known allergies.   Social History   Socioeconomic History   Marital status: Married    Spouse name: Not on file   Number of children: Not on file   Years of education: Not on file   Highest education level: Not on file  Occupational History   Not on file  Tobacco Use   Smoking status: Never   Smokeless tobacco: Never  Vaping Use   Vaping Use: Never  used  Substance and Sexual Activity   Alcohol use: Yes    Alcohol/week: 14.0 standard drinks    Types: 14 Glasses of wine per week    Comment: 2 glasses daily   Drug use: No   Sexual activity: Yes  Other Topics Concern   Not on file  Social History Narrative   Not on file   Social Determinants of Health   Financial Resource Strain: Not on file  Food Insecurity: Not on file  Transportation Needs: Not on file  Physical Activity: Not on file  Stress: Not on file  Social Connections: Not on file     Family History: The patient's family history includes Heart Problems in his mother.  ROS:   Please see the history of present illness.     All other systems reviewed and are negative.  EKGs/Labs/Other Studies Reviewed:    The following studies were reviewed today:   EKG:   Recent Labs: 12/20/2020: TSH 1.220 03/08/2021: BUN 18; Creatinine, Ser 1.15; Hemoglobin 15.4; Platelets 213; Potassium 5.0; Sodium 140  Recent Lipid Panel    Component Value Date/Time   CHOL 179 09/07/2014 0109   TRIG 96 09/07/2014 0109   HDL 49 09/07/2014 0109   CHOLHDL 3.7 09/07/2014 0109   VLDL 19 09/07/2014 0109   LDLCALC 111 (H) 09/07/2014 0109     Risk Assessment/Calculations:    CHA2DS2-VASc Score = 6  This indicates a 9.7% annual risk of stroke. The patient's score is based upon: CHF History: 1 HTN History: 1 Diabetes History: 0 Stroke History: 2 Vascular Disease History: 1 Age Score: 1 Gender Score: 0    Physical Exam:    Physical Exam: Blood pressure 140/88, pulse 97, height 6\' 2"  (1.88 m), weight 237 lb (107.5 kg), SpO2 99 %.  GEN:  Well nourished, well developed in no acute distress HEENT: Normal NECK: No JVD; No carotid bruits LYMPHATICS: No lymphadenopathy CARDIAC: Irreg.  Irreg. , no murmurs, rubs, gallops RESPIRATORY:  Clear to auscultation without rales, wheezing or rhonchi  ABDOMEN: Soft, non-tender, non-distended MUSCULOSKELETAL:  No edema; No deformity  SKIN:  Warm and dry NEUROLOGIC:  Alert and oriented x 3   ASSESSMENT:    1. Persistent atrial fibrillation ([place])   2. Chronic diastolic heart failure (HCC)     PLAN:   1.  Atrial fibrillation: Timothy Cooley presents for further discussion of his atrial fibrillation.  He is in persistent atrial fibrillation.  He seen the cardiologist at [facility] and would like to consider ablation.  He would like to discuss this with our electrophysiologist and not just the A. fib clinic.  I told him  that I think that this is a reasonable choice for him.  I think his long-term health might improve with a successful ablation.  We will have him see one of our electrophysiologist.  Inform the that he needs a colonoscopy.  He is at low risk for his upcoming colonoscopy.  He will be acceptable for him to hold his Eliquis for 2 days prior to his colonoscopy and then restart the Eliquis as soon as we are given the okay from a GI standpoint.     Shared Decision Making/Informed Consent{   The risks (stroke, cardiac arrhythmias rarely resulting in the need for a temporary or permanent pacemaker, skin irritation or burns and complications associated with conscious sedation including aspiration, arrhythmia, respiratory failure and death), benefits (restoration of normal sinus rhythm) and alternatives of a direct current cardioversion were explained in detail to Timothy Cooley and he agrees to proceed.       Medication Adjustments/Labs and Tests Ordered: Current medicines are reviewed at length with the patient today.  Concerns regarding medicines are outlined above.  Orders Placed This Encounter  Procedures   Ambulatory referral to Cardiac Electrophysiology   No orders of the defined types were placed in this encounter.    Patient Instructions  Medication Instructions:  Your physician recommends that you continue on your current medications as directed. Please refer to the Current Medication list given to you today.  *If you  need a refill on your cardiac medications before your next appointment, please call your pharmacy*   Lab Work: none If you have labs (blood work) drawn today and your tests are completely normal, you will receive your results only by: [place] (if you have MyChart) OR A paper copy in the mail If you have any lab test that is abnormal or we need to change your treatment, we will call you to review the results.   Testing/Procedures: none   Follow-Up: At [facility], you and your health needs are our priority.  As part of our continuing mission to provide you with exceptional heart care, we have created designated Provider Care Teams.  These Care Teams include your primary Cardiologist (physician) and Advanced Practice Providers (APPs -  Physician Assistants and Nurse Practitioners) who all work together to provide you with the care you need, when you need it.  We recommend signing up for the patient portal called "MyChart".  Sign up information is provided on this After Visit Summary.  MyChart is used to connect with patients for Virtual Visits (Telemedicine).  Patients are able to view lab/test results, encounter notes, upcoming appointments, etc.  Non-urgent messages can be sent to your provider as well.   To learn more about what you can do with MyChart, go to [email or web address].    Your next appointment:   1 year(s)  The format for your next appointment:   In Person  Provider:   Richardson Dopp, PA-C         Other Instructions Referral to Dr. Quentin Ore EP for potential Afib Ablation     Signed, Mertie Moores, MD  09/19/2021 4:07 PM    [place]

## 2021-09-19 ENCOUNTER — Telehealth: Payer: Self-pay

## 2021-09-19 ENCOUNTER — Encounter: Payer: Self-pay | Admitting: Cardiovascular Disease

## 2021-09-19 ENCOUNTER — Other Ambulatory Visit: Payer: Self-pay

## 2021-09-19 ENCOUNTER — Ambulatory Visit (INDEPENDENT_AMBULATORY_CARE_PROVIDER_SITE_OTHER): Payer: Medicare Other | Admitting: Cardiovascular Disease

## 2021-09-19 VITALS — BP 140/88 | HR 97 | Ht 74.0 in | Wt 237.0 lb

## 2021-09-19 DIAGNOSIS — I4819 Other persistent atrial fibrillation: Secondary | ICD-10-CM | POA: Diagnosis not present

## 2021-09-19 DIAGNOSIS — I5032 Chronic diastolic (congestive) heart failure: Secondary | ICD-10-CM

## 2021-09-19 NOTE — Patient Instructions (Signed)
Medication Instructions:  Your physician recommends that you continue on your current medications as directed. Please refer to the Current Medication list given to you today.  *If you need a refill on your cardiac medications before your next appointment, please call your pharmacy*   Lab Work: none If you have labs (blood work) drawn today and your tests are completely normal, you will receive your results only by: MyChart Message (if you have MyChart) OR A paper copy in the mail If you have any lab test that is abnormal or we need to change your treatment, we will call you to review the results.   Testing/Procedures: none   Follow-Up: At [facility], you and your health needs are our priority.  As part of our continuing mission to provide you with exceptional heart care, we have created designated Provider Care Teams.  These Care Teams include your primary Cardiologist (physician) and Advanced Practice Providers (APPs -  Physician Assistants and Nurse Practitioners) who all work together to provide you with the care you need, when you need it.  We recommend signing up for the patient portal called "MyChart".  Sign up information is provided on this After Visit Summary.  MyChart is used to connect with patients for Virtual Visits (Telemedicine).  Patients are able to view lab/test results, encounter notes, upcoming appointments, etc.  Non-urgent messages can be sent to your provider as well.   To learn more about what you can do with MyChart, go to [email or web address].    Your next appointment:   1 year(s)  The format for your next appointment:   In Person  Provider:   Tereso Newcomer, PA-C         Other Instructions Referral to Dr. Lalla Brothers EP for potential Afib Ablation

## 2021-09-19 NOTE — Telephone Encounter (Signed)
Left message requesting call back to confirm appt.

## 2021-10-21 DIAGNOSIS — H2513 Age-related nuclear cataract, bilateral: Secondary | ICD-10-CM | POA: Diagnosis not present

## 2021-10-21 DIAGNOSIS — H52203 Unspecified astigmatism, bilateral: Secondary | ICD-10-CM | POA: Diagnosis not present

## 2021-10-21 DIAGNOSIS — H5203 Hypermetropia, bilateral: Secondary | ICD-10-CM | POA: Diagnosis not present

## 2021-11-06 ENCOUNTER — Other Ambulatory Visit: Payer: Self-pay | Admitting: Cardiovascular Disease

## 2021-11-07 ENCOUNTER — Other Ambulatory Visit: Payer: Self-pay

## 2021-11-07 ENCOUNTER — Ambulatory Visit (INDEPENDENT_AMBULATORY_CARE_PROVIDER_SITE_OTHER): Payer: Medicare Other | Admitting: Cardiology

## 2021-11-07 ENCOUNTER — Encounter: Payer: Self-pay | Admitting: Cardiology

## 2021-11-07 ENCOUNTER — Encounter: Payer: Self-pay | Admitting: *Deleted

## 2021-11-07 VITALS — BP 118/68 | HR 93 | Ht 74.0 in | Wt 225.4 lb

## 2021-11-07 DIAGNOSIS — Z01818 Encounter for other preprocedural examination: Secondary | ICD-10-CM

## 2021-11-07 DIAGNOSIS — I10 Essential (primary) hypertension: Secondary | ICD-10-CM | POA: Diagnosis not present

## 2021-11-07 DIAGNOSIS — I4891 Unspecified atrial fibrillation: Secondary | ICD-10-CM

## 2021-11-07 DIAGNOSIS — I700 Atherosclerosis of aorta: Secondary | ICD-10-CM

## 2021-11-07 MED ORDER — METOPROLOL TARTRATE 50 MG PO TABS: 50.0000 mg | ORAL_TABLET | Freq: Once | ORAL | 0 refills | Status: DC | PRN

## 2021-11-07 NOTE — Patient Instructions (Addendum)
Medication Instructions:  °Your physician recommends that you continue on your current medications as directed. Please refer to the Current Medication list given to you today. °*If you need a refill on your cardiac medications before your next appointment, please call your pharmacy* ° °Lab Work: °None. °If you have labs (blood work) drawn today and your tests are completely normal, you will receive your results only by: °MyChart Message (if you have MyChart) OR °A paper copy in the mail °If you have any lab test that is abnormal or we need to change your treatment, we will call you to review the results. ° °Testing/Procedures: °Your physician has requested that you have cardiac CT. Cardiac computed tomography (CT) is a painless test that uses an x-ray machine to take clear, detailed pictures of your heart. For further information please visit www.cardiosmart.org. Please follow instruction sheet as given. °  °Your physician has recommended that you have an ablation. Catheter ablation is a medical procedure used to treat some cardiac arrhythmias (irregular heartbeats). During catheter ablation, a long, thin, flexible tube is put into a blood vessel in your groin (upper thigh), or neck. This tube is called an ablation catheter. It is then guided to your heart through the blood vessel. Radio frequency waves destroy small areas of heart tissue where abnormal heartbeats may cause an arrhythmia to start. Please see the instruction sheet given to you today. ° ° °Follow-Up: °At [facility], you and your health needs are our priority.  As part of our continuing mission to provide you with exceptional heart care, we have created designated Provider Care Teams.  These Care Teams include your primary Cardiologist (physician) and Advanced Practice Providers (APPs -  Physician Assistants and Nurse Practitioners) who all work together to provide you with the care you need, when you need it. ° °Your physician wants you to  follow-up in: see instruction letter.  ° °We recommend signing up for the patient portal called "MyChart".  Sign up information is provided on this After Visit Summary.  MyChart is used to connect with patients for Virtual Visits (Telemedicine).  Patients are able to view lab/test results, encounter notes, upcoming appointments, etc.  Non-urgent messages can be sent to your provider as well.   °To learn more about what you can do with MyChart, go to https://www.mychart.com.   ° °Any Other Special Instructions Will Be Listed Below (If Applicable). ° °Cardiac Ablation °Cardiac ablation is a procedure to destroy (ablate) some heart tissue that is sending bad signals. These bad signals cause problems in heart rhythm. °The heart has many areas that make these signals. If there are problems in these areas, they can make the heart beat in a way that is not normal. Destroying some tissues can help make the heart rhythm normal. °Tell your doctor about: °Any allergies you have. °All medicines you are taking. These include vitamins, herbs, eye drops, creams, and over-the-counter medicines. °Any problems you or family members have had with medicines that make you fall asleep (anesthetics). °Any blood disorders you have. °Any surgeries you have had. °Any medical conditions you have, such as kidney failure. °Whether you are pregnant or may be pregnant. °What are the risks? °This is a safe procedure. But problems may occur, including: °Infection. °Bruising and bleeding. °Bleeding into the chest. °Stroke or blood clots. °Damage to nearby areas of your body. °Allergies to medicines or dyes. °The need for a pacemaker if the normal system is damaged. °Failure of the procedure to treat the problem. °  What happens before the procedure? °Medicines °Ask your doctor about: °Changing or stopping your normal medicines. This is important. °Taking aspirin and ibuprofen. Do not take these medicines unless your doctor tells you to take  them. °Taking other medicines, vitamins, herbs, and supplements. °General instructions °Follow instructions from your doctor about what you cannot eat or drink. °Plan to have someone take you home from the hospital or clinic. °If you will be going home right after the procedure, plan to have someone with you for 24 hours. °Ask your doctor what steps will be taken to prevent infection. °What happens during the procedure? ° °An IV tube will be put into one of your veins. °You will be given a medicine to help you relax. °The skin on your neck or groin will be numbed. °A cut (incision) will be made in your neck or groin. A needle will be put through your cut and into a large vein. °A tube (catheter) will be put into the needle. The tube will be moved to your heart. °Dye may be put through the tube. This helps your doctor see your heart. °Small devices (electrodes) on the tube will send out signals. °A type of energy will be used to destroy some heart tissue. °The tube will be taken out. °Pressure will be held on your cut. This helps stop bleeding. °A bandage will be put over your cut. °The exact procedure may vary among doctors and hospitals. °What happens after the procedure? °You will be watched until you leave the hospital or clinic. This includes checking your heart rate, breathing rate, oxygen, and blood pressure. °Your cut will be watched for bleeding. You will need to lie still for a few hours. °Do not drive for 24 hours or as long as your doctor tells you. °Summary °Cardiac ablation is a procedure to destroy some heart tissue. This is done to treat heart rhythm problems. °Tell your doctor about any medical conditions you may have. Tell him or her about all medicines you are taking to treat them. °This is a safe procedure. But problems may occur. These include infection, bruising, bleeding, and damage to nearby areas of your body. °Follow what your doctor tells you about food and drink. You may also be told to  change or stop some of your medicines. °After the procedure, do not drive for 24 hours or as long as your doctor tells you. °This information is not intended to replace advice given to you by your health care provider. Make sure you discuss any questions you have with your health care provider. °Document Revised: 07/31/2019 Document Reviewed: 07/31/2019 °Elsevier Patient Education © 2022 [employer] Inc. ° ° ° °  ° ° °

## 2021-11-07 NOTE — Progress Notes (Signed)
Electrophysiology Office Note:    Date:  11/07/2021   ID:  [name], [name] [birth year], MRN [id number]  PCP:  Sueanne Margarita, DO  [facility] Cardiologist:  Mertie Moores, MD  [facility] Electrophysiologist:  Vickie Epley, MD   Referring MD: Thayer Headings, MD   Chief Complaint: Consult for AF ablation  History of Present Illness:    Timothy Cooley is a 66 y.o. male who presents for an evaluation of possible AF ablation at the request of Dr. Acie Fredrickson. Their medical history includes persistent atrial fibrillation, hypertension, hyperlipidemia, TIA, HFpEF, and aortic atherosclerosis.  He was seen by Dr. Acie Fredrickson 09/19/2021 and noted to be in persistent atrial fibrillation. He wished to further discuss possible ablation with EP.  Overall, he is feeling okay. He is unable to appreciate any symptoms while in atrial fibrillation. He denies any known triggers. Generally, his heart rate has consistently ranged 75-100 bpm.  Normally he is very active. Earlier today he had a 1 hour workout with a Physiological scientist.  He denies any palpitations, chest pain, shortness of breath, or peripheral edema. No lightheadedness, headaches, syncope, orthopnea, or PND.  He is very motivated to get back into normal rhythm. He was previously seen by Jeanie Sewer at [place].  I have reviewed their notes from the October 26 consult.  At that visit, antiarrhythmic drug therapy and catheter ablation were offered.      Past Medical History:  Diagnosis Date   (HFpEF) heart failure with preserved ejection fraction (HCC)    EF 60-65, no RWMA, normal RVSF, mild LAE, mild dilation of ascending aorta (41 mm)   Aortic atherosclerosis (HCC)    CT in 01/2021   Coronary artery calcification seen on CT scan    CT 5/22: Ca2+ score 559 - 84th percentile   History of TIA (transient ischemic attack) 2015   Hyperlipidemia    Hypertension    Persistent atrial fibrillation ([place])    Prediabetes      Past Surgical History:  Procedure Laterality Date   APPLICATION OF ROBOTIC ASSISTANCE FOR SPINAL PROCEDURE N/A 10/08/2018   Procedure: APPLICATION OF ROBOTIC ASSISTANCE FOR SPINAL PROCEDURE;  Surgeon: Kristeen Miss, MD;  Location: [facility];  Service: Neurosurgery;  Laterality: N/A;   CARDIOVERSION N/A 03/11/2021   Procedure: CARDIOVERSION;  Surgeon: Geralynn Rile, MD;  Location: [place];  Service: Cardiovascular;  Laterality: N/A;   NO PAST SURGERIES     none     POSTERIOR CERVICAL FUSION/FORAMINOTOMY N/A 10/08/2018   Procedure: Cervical Seven to Thoracic One laminectomy/foraminotomy with posterior fusion Cervical Seven to Thoracic One using Mazor;  Surgeon: Kristeen Miss, MD;  Location: [place];  Service: Neurosurgery;  Laterality: N/A;  Cervical Seven to Thoracic One laminectomy/foraminotomy with posterior fusion Cervical Seven to Thoracic One using Mazor    Current Medications: Current Meds  Medication Sig   amitriptyline (ELAVIL) 25 MG tablet Take 50 mg by mouth at bedtime.   amoxicillin (AMOXIL) 500 MG capsule Take 500 mg by mouth 3 (three) times daily.   apixaban (ELIQUIS) 5 MG TABS tablet TAKE 1 TABLET BY MOUTH TWICE A DAY   atorvastatin (LIPITOR) 40 MG tablet Take 40 mg by mouth in the morning.   Docusate Sodium (DULCOLAX STOOL SOFTENER PO) Take by mouth.   losartan (COZAAR) 100 MG tablet Take 100 mg by mouth in the morning.   metoprolol tartrate (LOPRESSOR) 50 MG tablet Take 1 tablet (50 mg total) by mouth Once PRN (IF your  heart rate is over 75 two hours before you scan.).   Multiple Vitamin (MULTIVITAMIN WITH MINERALS) TABS tablet Take 1 tablet by mouth daily. Adult 50+     Allergies:   Patient has no known allergies.   Social History   Socioeconomic History   Marital status: Married    Spouse name: Not on file   Number of children: Not on file   Years of education: Not on file   Highest education level: Not on file  Occupational History   Not on file   Tobacco Use   Smoking status: Never   Smokeless tobacco: Never  Vaping Use   Vaping Use: Never used  Substance and Sexual Activity   Alcohol use: Yes    Alcohol/week: 14.0 standard drinks    Types: 14 Glasses of wine per week    Comment: 2 glasses daily   Drug use: No   Sexual activity: Yes  Other Topics Concern   Not on file  Social History Narrative   Not on file   Social Determinants of Health   Financial Resource Strain: Not on file  Food Insecurity: Not on file  Transportation Needs: Not on file  Physical Activity: Not on file  Stress: Not on file  Social Connections: Not on file     Family History: The patient's family history includes Heart Problems in his mother.  ROS:   Please see the history of present illness.    All other systems reviewed and are negative.  EKGs/Labs/Other Studies Reviewed:    The following studies were reviewed today:  Lexiscan Myoview 02/11/2021: Nuclear stress EF: 57%. The left ventricular ejection fraction is normal (55-65%). There was no ST segment deviation noted during stress. No T wave inversion was noted during stress. The study is normal. This is a low risk study.   1.  Normal myocardial perfusion imaging study without evidence of ischemia or infarction. 2.  Normal LVEF, 57%. 3.  This is a low risk study.  Echo 01/24/2021:  1. Left ventricular ejection fraction, by estimation, is 60 to 65%. The  left ventricle has normal function. The left ventricle has no regional  wall motion abnormalities. Left ventricular diastolic parameters are  indeterminate.   2. Right ventricular systolic function is normal. The right ventricular  size is mildly enlarged.   3. Left atrial size was mildly dilated.   4. The mitral valve is normal in structure. No evidence of mitral valve  regurgitation. No evidence of mitral stenosis.   5. The aortic valve is normal in structure. Aortic valve regurgitation is  not visualized. No aortic stenosis  is present.   6. Aortic dilatation noted. There is mild dilatation of the ascending  aorta, measuring 41 mm.   7. The inferior vena cava is normal in size with greater than 50%  respiratory variability, suggesting right atrial pressure of 3 mmHg.   Comparison(s): No significant change from prior study. Prior images  reviewed side by side.   Calcium Score 01/24/2021: FINDINGS: Coronary arteries: Normal origins.   Coronary Calcium Score:   Left main: 201   Left anterior descending artery: 306   Left circumflex artery: 22   Right coronary artery: 30   Total: 559   Percentile: 84   Pericardium: Normal.   Ascending Aorta: Dilated ascending aorta measuring 55mm   Non-cardiac: See separate report from [facility].   IMPRESSION: 1. Coronary calcium score of 559. This was 84th percentile for age-, race-, and sex-matched controls.  2.  Dilated ascending aorta measuring 42mm  EKG:   EKG is personally reviewed.  11/07/2021: Atrial fibrillation   Recent Labs: 12/20/2020: TSH 1.220 03/08/2021: BUN 18; Creatinine, Ser 1.15; Hemoglobin 15.4; Platelets 213; Potassium 5.0; Sodium 140   Recent Lipid Panel    Component Value Date/Time   CHOL 179 09/07/2014 0109   TRIG 96 09/07/2014 0109   HDL 49 09/07/2014 0109   CHOLHDL 3.7 09/07/2014 0109   VLDL 19 09/07/2014 0109   LDLCALC 111 (H) 09/07/2014 0109    Physical Exam:    VS:  BP 118/68    Pulse 93    Ht 6\' 2"  (1.88 m)    Wt 225 lb 6.4 oz (102.2 kg)    SpO2 98%    BMI 28.94 kg/m     Wt Readings from Last 3 Encounters:  11/07/21 225 lb 6.4 oz (102.2 kg)  09/19/21 237 lb (107.5 kg)  03/23/21 235 lb 12.8 oz (107 kg)     GEN: Well nourished, well developed in no acute distress HEENT: Normal NECK: No JVD; No carotid bruits LYMPHATICS: No lymphadenopathy CARDIAC: Irregularly irregular, no murmurs, rubs, gallops RESPIRATORY:  Clear to auscultation without rales, wheezing or rhonchi  ABDOMEN: Soft, non-tender,  non-distended MUSCULOSKELETAL:  No edema; No deformity  SKIN: Warm and dry NEUROLOGIC:  Alert and oriented x 3 PSYCHIATRIC:  Normal affect       ASSESSMENT:    1. Pre-op evaluation   2. Atrial fibrillation, unspecified type ([place])   3. Essential hypertension   4. Aortic atherosclerosis (HCC)    PLAN:    In order of problems listed above:  #Persistent atrial fibrillation Diagnosis in April 2022.  EKGs from 2020 demonstrates sinus rhythm.  He is relatively asymptomatic with his atrial fibrillation.  His left ventricular function is normal.  The patient is very motivated to try to get back to normal rhythm which I think is not unreasonable based on available clinical trial data.  We had a very extensive conversation during today's visit about antiarrhythmic drugs and catheter ablation.  We discussed the catheter ablation procedure in detail include the risks, recovery and likelihood of success.  We discussed the possibility of needing a second ablation procedure.  We discussed the possibility of needing antiarrhythmic drug therapy after an initial ablation attempt.  He would like to proceed with scheduling.  He will continue Eliquis in the periprocedural period.   Risk, benefits, and alternatives to EP study and radiofrequency ablation for afib were also discussed in detail today. These risks include but are not limited to stroke, bleeding, vascular damage, tamponade, perforation, damage to the esophagus, lungs, and other structures, pulmonary vein stenosis, worsening renal function, and death. The patient understands these risk and wishes to proceed.  We will therefore proceed with catheter ablation at the next available time.  Carto, ICE, anesthesia are requested for the procedure.  Will also obtain CT PV protocol prior to the procedure to exclude LAA thrombus and further evaluate atrial anatomy.   Total time spent with patient today 65 minutes. This includes reviewing records, evaluating  the patient and coordinating care.  Medication Adjustments/Labs and Tests Ordered: Current medicines are reviewed at length with the patient today.  Concerns regarding medicines are outlined above.  Orders Placed This Encounter  Procedures   CT CARDIAC MORPH/PULM VEIN W/CM&W/O CA SCORE   CBC w/Diff   Basic Metabolic Panel (BMET)   EKG 12-Lead   Meds ordered this encounter  Medications   metoprolol tartrate (  LOPRESSOR) 50 MG tablet    Sig: Take 1 tablet (50 mg total) by mouth Once PRN (IF your heart rate is over 75 two hours before you scan.).    Dispense:  1 tablet    Refill:  0    I,Mathew Stumpf,acting as a scribe for Vickie Epley, MD.,have documented all relevant documentation on the behalf of Vickie Epley, MD,as directed by  Vickie Epley, MD while in the presence of Vickie Epley, MD.  I, Vickie Epley, MD, have reviewed all documentation for this visit. The documentation on 11/07/21 for the exam, diagnosis, procedures, and orders are all accurate and complete.   Signed, Timothy Cork. Quentin Ore, MD, [facility], [facility] 11/07/2021 9:31 PM    Electrophysiology [facility]

## 2021-11-07 NOTE — Telephone Encounter (Signed)
Eliquis 5 mg refill request received. Patient is 66 years old, weight- 107.5 kg, Crea- 1.15 on 03/08/21 , Diagnosis- afib, and last seen by Dr. Elease Hashimoto on 09/19/21. Dose is appropriate based on dosing criteria. Will send in refill to requested pharmacy.

## 2021-11-11 DIAGNOSIS — Z86010 Personal history of colonic polyps: Secondary | ICD-10-CM | POA: Diagnosis not present

## 2021-11-11 DIAGNOSIS — K5901 Slow transit constipation: Secondary | ICD-10-CM | POA: Diagnosis not present

## 2021-11-14 ENCOUNTER — Encounter: Payer: Self-pay | Admitting: Cardiovascular Disease

## 2021-11-14 ENCOUNTER — Encounter: Payer: Self-pay | Admitting: Cardiology

## 2021-11-22 DIAGNOSIS — Z86010 Personal history of colonic polyps: Secondary | ICD-10-CM | POA: Diagnosis not present

## 2021-11-22 DIAGNOSIS — D122 Benign neoplasm of ascending colon: Secondary | ICD-10-CM | POA: Diagnosis not present

## 2021-11-22 DIAGNOSIS — K649 Unspecified hemorrhoids: Secondary | ICD-10-CM | POA: Diagnosis not present

## 2021-11-22 DIAGNOSIS — D123 Benign neoplasm of transverse colon: Secondary | ICD-10-CM | POA: Diagnosis not present

## 2021-11-24 DIAGNOSIS — D122 Benign neoplasm of ascending colon: Secondary | ICD-10-CM | POA: Diagnosis not present

## 2021-11-24 DIAGNOSIS — D123 Benign neoplasm of transverse colon: Secondary | ICD-10-CM | POA: Diagnosis not present

## 2021-12-09 DIAGNOSIS — I10 Essential (primary) hypertension: Secondary | ICD-10-CM | POA: Diagnosis not present

## 2021-12-09 DIAGNOSIS — H6121 Impacted cerumen, right ear: Secondary | ICD-10-CM | POA: Diagnosis not present

## 2021-12-26 ENCOUNTER — Other Ambulatory Visit: Payer: Medicare Other

## 2021-12-26 DIAGNOSIS — Z01818 Encounter for other preprocedural examination: Secondary | ICD-10-CM

## 2021-12-26 DIAGNOSIS — I4891 Unspecified atrial fibrillation: Secondary | ICD-10-CM

## 2021-12-26 LAB — BASIC METABOLIC PANEL
BUN/Creatinine Ratio: 15 (ref 10–24)
BUN: 17 mg/dL (ref 8–27)
CO2: 27 mmol/L (ref 20–29)
Calcium: 9.9 mg/dL (ref 8.6–10.2)
Chloride: 100 mmol/L (ref 96–106)
Creatinine, Ser: 1.12 mg/dL (ref 0.76–1.27)
Glucose: 134 mg/dL — ABNORMAL HIGH (ref 70–99)
Potassium: 4.6 mmol/L (ref 3.5–5.2)
Sodium: 136 mmol/L (ref 134–144)
eGFR: 72 mL/min/{1.73_m2} (ref >59)

## 2021-12-26 LAB — CBC WITH DIFFERENTIAL/PLATELET
Basophils Absolute: 0 10*3/uL (ref 0.0–0.2)
Basos: 1 %
EOS (ABSOLUTE): 0.3 10*3/uL (ref 0.0–0.4)
Eos: 5 %
Hematocrit: 43.4 % (ref 37.5–51.0)
Hemoglobin: 14.7 g/dL (ref 13.0–17.7)
Lymphocytes Absolute: 2 10*3/uL (ref 0.7–3.1)
Lymphs: 33 %
MCH: 28.8 pg (ref 26.6–33.0)
MCHC: 33.9 g/dL (ref 31.5–35.7)
MCV: 85 fL (ref 79–97)
Monocytes Absolute: 0.5 10*3/uL (ref 0.1–0.9)
Monocytes: 9 %
Neutrophils Absolute: 3.1 10*3/uL (ref 1.4–7.0)
Neutrophils: 52 %
Platelets: 187 10*3/uL (ref 150–450)
RBC: 5.1 x10E6/uL (ref 4.14–5.80)
RDW: 14.7 % (ref 11.6–15.4)
WBC: 5.9 10*3/uL (ref 3.4–10.8)

## 2022-01-06 ENCOUNTER — Telehealth (HOSPITAL_COMMUNITY): Payer: Self-pay | Admitting: Emergency Medicine

## 2022-01-06 NOTE — Telephone Encounter (Signed)
Attempted to call patient regarding upcoming cardiac CT appointment. °Left message on voicemail with name and callback number °Dayle Sherpa RN Navigator Cardiac Imaging °[facility] °[phone] Office °[phone] Cell ° °

## 2022-01-09 ENCOUNTER — Ambulatory Visit (HOSPITAL_COMMUNITY)
Admission: RE | Admit: 2022-01-09 | Discharge: 2022-01-09 | Disposition: A | Discharge status: Home / Self Care | Payer: Medicare Other | Source: Ambulatory Visit | Attending: Cardiology | Admitting: Cardiology

## 2022-01-09 ENCOUNTER — Encounter (HOSPITAL_COMMUNITY): Payer: Self-pay

## 2022-01-09 DIAGNOSIS — I4891 Unspecified atrial fibrillation: Secondary | ICD-10-CM | POA: Diagnosis not present

## 2022-01-09 MED ORDER — IOHEXOL 350 MG/ML SOLN
100.0000 mL | Freq: Once | INTRAVENOUS | Status: AC | PRN
  Administered 2022-01-09: 100 mL via INTRAVENOUS

## 2022-01-13 NOTE — Pre-Procedure Instructions (Signed)
Instructed patient on the following items: Arrival time 0830 Nothing to eat or drink after midnight No meds AM of procedure Responsible person to drive you home and stay with you for 24 hrs  Have you missed any doses of anti-coagulant Eliquis- hasn't missed any doses   

## 2022-01-16 ENCOUNTER — Ambulatory Visit (HOSPITAL_COMMUNITY)
Admission: RE | Admit: 2022-01-16 | Discharge: 2022-01-16 | Disposition: A | Discharge status: Home / Self Care | Payer: Medicare Other | Attending: Cardiology | Admitting: Cardiology

## 2022-01-16 ENCOUNTER — Encounter (HOSPITAL_COMMUNITY)
Admission: RE | Disposition: A | Discharge status: Home / Self Care | Payer: Self-pay | Source: Home / Self Care | Attending: Cardiology

## 2022-01-16 ENCOUNTER — Other Ambulatory Visit: Payer: Self-pay

## 2022-01-16 ENCOUNTER — Ambulatory Visit (HOSPITAL_COMMUNITY): Payer: Medicare Other | Admitting: Anesthesiology

## 2022-01-16 ENCOUNTER — Ambulatory Visit (HOSPITAL_BASED_OUTPATIENT_CLINIC_OR_DEPARTMENT_OTHER): Payer: Medicare Other | Admitting: Anesthesiology

## 2022-01-16 ENCOUNTER — Encounter (HOSPITAL_COMMUNITY): Payer: Self-pay | Admitting: Cardiology

## 2022-01-16 ENCOUNTER — Other Ambulatory Visit (HOSPITAL_COMMUNITY): Payer: Self-pay

## 2022-01-16 DIAGNOSIS — Z7901 Long term (current) use of anticoagulants: Secondary | ICD-10-CM | POA: Diagnosis not present

## 2022-01-16 DIAGNOSIS — Z8673 Personal history of transient ischemic attack (TIA), and cerebral infarction without residual deficits: Secondary | ICD-10-CM | POA: Diagnosis not present

## 2022-01-16 DIAGNOSIS — E785 Hyperlipidemia, unspecified: Secondary | ICD-10-CM | POA: Insufficient documentation

## 2022-01-16 DIAGNOSIS — I4819 Other persistent atrial fibrillation: Secondary | ICD-10-CM | POA: Insufficient documentation

## 2022-01-16 DIAGNOSIS — I5032 Chronic diastolic (congestive) heart failure: Secondary | ICD-10-CM | POA: Diagnosis not present

## 2022-01-16 DIAGNOSIS — I4891 Unspecified atrial fibrillation: Secondary | ICD-10-CM

## 2022-01-16 DIAGNOSIS — I2510 Atherosclerotic heart disease of native coronary artery without angina pectoris: Secondary | ICD-10-CM | POA: Diagnosis not present

## 2022-01-16 DIAGNOSIS — I110 Hypertensive heart disease with heart failure: Secondary | ICD-10-CM | POA: Insufficient documentation

## 2022-01-16 DIAGNOSIS — I10 Essential (primary) hypertension: Secondary | ICD-10-CM | POA: Diagnosis not present

## 2022-01-16 DIAGNOSIS — I700 Atherosclerosis of aorta: Secondary | ICD-10-CM | POA: Insufficient documentation

## 2022-01-16 HISTORY — PX: ATRIAL FIBRILLATION ABLATION: EP1191

## 2022-01-16 LAB — POCT ACTIVATED CLOTTING TIME
Activated Clotting Time: 275 seconds
Activated Clotting Time: 311 seconds

## 2022-01-16 SURGERY — ATRIAL FIBRILLATION ABLATION
Anesthesia: General

## 2022-01-16 MED ORDER — HEPARIN SODIUM (PORCINE) 1000 UNIT/ML IJ SOLN
INTRAMUSCULAR | Status: DC | PRN
Start: 2022-01-16 — End: 2022-01-16
  Administered 2022-01-16: 16000 [IU] via INTRAVENOUS
  Administered 2022-01-16: 4000 [IU] via INTRAVENOUS
  Administered 2022-01-16: 5000 [IU] via INTRAVENOUS

## 2022-01-16 MED ORDER — PANTOPRAZOLE SODIUM 40 MG PO TBEC
40.0000 mg | DELAYED_RELEASE_TABLET | Freq: Every day | ORAL | 0 refills | Status: DC
  Filled 2022-01-16: qty 30, 30d supply, fill #0

## 2022-01-16 MED ORDER — SODIUM CHLORIDE 0.9 % IV SOLN: INTRAVENOUS | Status: DC

## 2022-01-16 MED ORDER — ROCURONIUM BROMIDE 10 MG/ML (PF) SYRINGE
PREFILLED_SYRINGE | INTRAVENOUS | Status: DC | PRN
Start: 2022-01-16 — End: 2022-01-16
  Administered 2022-01-16: 60 mg via INTRAVENOUS
  Administered 2022-01-16 (×3): 30 mg via INTRAVENOUS

## 2022-01-16 MED ORDER — HEPARIN (PORCINE) IN NACL 1000-0.9 UT/500ML-% IV SOLN
INTRAVENOUS | Status: DC | PRN
  Administered 2022-01-16 (×4): 500 mL

## 2022-01-16 MED ORDER — PROPOFOL 10 MG/ML IV BOLUS
INTRAVENOUS | Status: DC | PRN
Start: 2022-01-16 — End: 2022-01-16
  Administered 2022-01-16: 170 mg via INTRAVENOUS

## 2022-01-16 MED ORDER — APIXABAN 5 MG PO TABS
5.0000 mg | ORAL_TABLET | Freq: Two times a day (BID) | ORAL | Status: DC
  Administered 2022-01-16: 5 mg via ORAL
  Filled 2022-01-16: qty 1

## 2022-01-16 MED ORDER — ACETAMINOPHEN 325 MG PO TABS: 650.0000 mg | ORAL_TABLET | ORAL | Status: DC | PRN

## 2022-01-16 MED ORDER — PANTOPRAZOLE SODIUM 40 MG PO TBEC
40.0000 mg | DELAYED_RELEASE_TABLET | Freq: Every day | ORAL | Status: DC
  Administered 2022-01-16: 40 mg via ORAL
  Filled 2022-01-16: qty 1

## 2022-01-16 MED ORDER — ONDANSETRON HCL 4 MG/2ML IJ SOLN
INTRAMUSCULAR | Status: DC | PRN
  Administered 2022-01-16: 4 mg via INTRAVENOUS

## 2022-01-16 MED ORDER — SODIUM CHLORIDE 0.9 % IV SOLN: 250.0000 mL | INTRAVENOUS | Status: DC | PRN

## 2022-01-16 MED ORDER — MIDAZOLAM HCL 2 MG/2ML IJ SOLN
INTRAMUSCULAR | Status: DC | PRN
  Administered 2022-01-16: 2 mg via INTRAVENOUS

## 2022-01-16 MED ORDER — ACETAMINOPHEN 500 MG PO TABS
ORAL_TABLET | ORAL | Status: AC
  Filled 2022-01-16: qty 2

## 2022-01-16 MED ORDER — COLCHICINE 0.6 MG PO TABS
0.6000 mg | ORAL_TABLET | Freq: Two times a day (BID) | ORAL | Status: DC
  Administered 2022-01-16: 0.6 mg via ORAL
  Filled 2022-01-16: qty 1

## 2022-01-16 MED ORDER — PHENYLEPHRINE 80 MCG/ML (10ML) SYRINGE FOR IV PUSH (FOR BLOOD PRESSURE SUPPORT)
PREFILLED_SYRINGE | INTRAVENOUS | Status: DC | PRN
  Administered 2022-01-16: 80 ug via INTRAVENOUS
  Administered 2022-01-16: 160 ug via INTRAVENOUS

## 2022-01-16 MED ORDER — DEXAMETHASONE SODIUM PHOSPHATE 10 MG/ML IJ SOLN
INTRAMUSCULAR | Status: DC | PRN
  Administered 2022-01-16: 4 mg via INTRAVENOUS

## 2022-01-16 MED ORDER — SODIUM CHLORIDE 0.9% FLUSH: 3.0000 mL | INTRAVENOUS | Status: DC | PRN

## 2022-01-16 MED ORDER — SODIUM CHLORIDE 0.9% FLUSH: 3.0000 mL | Freq: Two times a day (BID) | INTRAVENOUS | Status: DC

## 2022-01-16 MED ORDER — ACETAMINOPHEN 500 MG PO TABS
1000.0000 mg | ORAL_TABLET | Freq: Once | ORAL | Status: AC
  Administered 2022-01-16: 1000 mg via ORAL

## 2022-01-16 MED ORDER — FENTANYL CITRATE (PF) 100 MCG/2ML IJ SOLN
INTRAMUSCULAR | Status: DC | PRN
  Administered 2022-01-16: 100 ug via INTRAVENOUS

## 2022-01-16 MED ORDER — ONDANSETRON HCL 4 MG/2ML IJ SOLN
4.0000 mg | Freq: Four times a day (QID) | INTRAMUSCULAR | Status: DC | PRN
Start: 2022-01-16 — End: 2022-01-16

## 2022-01-16 MED ORDER — PROTAMINE SULFATE 10 MG/ML IV SOLN
INTRAVENOUS | Status: DC | PRN
Start: 2022-01-16 — End: 2022-01-16
  Administered 2022-01-16: 35 mg via INTRAVENOUS

## 2022-01-16 MED ORDER — LIDOCAINE 2% (20 MG/ML) 5 ML SYRINGE
INTRAMUSCULAR | Status: DC | PRN
  Administered 2022-01-16: 100 mg via INTRAVENOUS

## 2022-01-16 MED ORDER — COLCHICINE 0.6 MG PO TABS
0.6000 mg | ORAL_TABLET | Freq: Two times a day (BID) | ORAL | 0 refills | Status: DC
  Filled 2022-01-16: qty 10, 5d supply, fill #0

## 2022-01-16 MED ORDER — PHENYLEPHRINE HCL-NACL 20-0.9 MG/250ML-% IV SOLN
INTRAVENOUS | Status: DC | PRN
  Administered 2022-01-16: 30 ug/min via INTRAVENOUS

## 2022-01-16 MED ORDER — HEPARIN SODIUM (PORCINE) 1000 UNIT/ML IJ SOLN
INTRAMUSCULAR | Status: DC | PRN
  Administered 2022-01-16: 1000 [IU] via INTRAVENOUS

## 2022-01-16 SURGICAL SUPPLY — 20 items
BLANKET WARM UNDERBOD FULL ACC (MISCELLANEOUS) ×2 IMPLANT
CATH 8FR REPROCESSED SOUNDSTAR (CATHETERS) ×2 IMPLANT
CATH 8FR SOUNDSTAR REPROCESSED (CATHETERS) IMPLANT
CATH OCTARAY 2.0 F 3-3-3-3-3 (CATHETERS) ×1 IMPLANT
CATH S CIRCA THERM PROBE 10F (CATHETERS) ×1 IMPLANT
CATH SMTCH THERMOCOOL SF DF (CATHETERS) ×1 IMPLANT
CATH WEBSTER BI DIR CS D-F CRV (CATHETERS) ×1 IMPLANT
CLOSURE PERCLOSE PROSTYLE (VASCULAR PRODUCTS) ×3 IMPLANT
COVER SWIFTLINK CONNECTOR (BAG) ×2 IMPLANT
MAT PREVALON FULL STRYKER (MISCELLANEOUS) ×1 IMPLANT
PACK EP LATEX FREE (CUSTOM PROCEDURE TRAY) ×2
PACK EP LF (CUSTOM PROCEDURE TRAY) ×1 IMPLANT
PAD DEFIB RADIO PHYSIO CONN (PAD) ×2 IMPLANT
PATCH CARTO3 (PAD) ×1 IMPLANT
SHEATH BAYLIS TRANSSEPTAL 98CM (NEEDLE) ×1 IMPLANT
SHEATH CARTO VIZIGO SM CVD (SHEATH) ×1 IMPLANT
SHEATH PINNACLE 8F 10CM (SHEATH) ×2 IMPLANT
SHEATH PINNACLE 9F 10CM (SHEATH) ×1 IMPLANT
SHEATH PROBE COVER 6X72 (BAG) ×1 IMPLANT
TUBING SMART ABLATE COOLFLOW (TUBING) ×1 IMPLANT

## 2022-01-16 NOTE — Anesthesia Postprocedure Evaluation (Signed)
Anesthesia Post Note ? ?Patient: Timothy Cooley ? ?Procedure(s) Performed: ATRIAL FIBRILLATION ABLATION ? ?  ? ?Patient location during evaluation: PACU ?Anesthesia Type: General ?Level of consciousness: awake and alert, patient cooperative and oriented ?Pain management: pain level controlled ?Vital Signs Assessment: post-procedure vital signs reviewed and stable ?Respiratory status: spontaneous breathing, nonlabored ventilation and respiratory function stable ?Cardiovascular status: blood pressure returned to baseline and stable ?Postop Assessment: no apparent nausea or vomiting and adequate PO intake ?Anesthetic complications: no ? ? ?No notable events documented. ? ?Last Vitals:  ?Vitals:  ? 01/16/22 1330 01/16/22 1335  ?BP: 121/79 125/75  ?Pulse: 80 79  ?Resp: 11 14  ?Temp:  36.8 ?C  ?SpO2: 96% 96%  ?  ?Last Pain:  ?Vitals:  ? 01/16/22 1344  ?TempSrc:   ?PainSc: 0-No pain  ? ? ?  ?  ?  ?  ?  ?  ? ?Shilah Hefel,E. Perkins Molina ? ? ? ? ?

## 2022-01-16 NOTE — Transfer of Care (Signed)
Immediate Anesthesia Transfer of Care Note ? ?Patient: Timothy Cooley ? ?Procedure(s) Performed: ATRIAL FIBRILLATION ABLATION ? ?Patient Location: PACU and Cath Lab ? ?Anesthesia Type:General ? ?Level of Consciousness: drowsy, patient cooperative and responds to stimulation ? ?Airway & Oxygen Therapy: Patient Spontanous Breathing and Patient connected to nasal cannula oxygen ? ?Post-op Assessment: Report given to RN and Post -op Vital signs reviewed and stable ? ?Post vital signs: Reviewed and stable ? ?Last Vitals:  ?Vitals Value Taken Time  ?BP 122/70 01/16/22 1301  ?Temp    ?Pulse 81 01/16/22 1303  ?Resp 0 01/16/22 1303  ?SpO2 98 % 01/16/22 1303  ?Vitals shown include unvalidated device data. ? ?Last Pain:  ?Vitals:  ? 01/16/22 1001  ?TempSrc:   ?PainSc: 0-No pain  ?   ? ?Patients Stated Pain Goal: 3 (01/16/22 1001) ? ?Complications: No notable events documented. ?

## 2022-01-16 NOTE — Anesthesia Procedure Notes (Signed)
Procedure Name: Intubation ?Date/Time: 01/16/2022 10:56 AM ?Performed by: Janace Litten, CRNA ?Pre-anesthesia Checklist: Patient identified, Emergency Drugs available, Suction available and Patient being monitored ?Patient Re-evaluated:Patient Re-evaluated prior to induction ?Oxygen Delivery Method: Circle System Utilized ?Preoxygenation: Pre-oxygenation with 100% oxygen ?Induction Type: IV induction ?Ventilation: Mask ventilation without difficulty ?Laryngoscope Size: Mac and 4 ?Grade View: Grade I ?Tube type: Oral ?Tube size: 7.5 mm ?Number of attempts: 1 ?Airway Equipment and Method: Stylet and Oral airway ?Placement Confirmation: ETT inserted through vocal cords under direct vision, positive ETCO2 and breath sounds checked- equal and bilateral ?Secured at: 23 cm ?Tube secured with: Tape ?Dental Injury: Teeth and Oropharynx as per pre-operative assessment  ? ? ? ? ?

## 2022-01-16 NOTE — H&P (Signed)
?Electrophysiology Office Note:   ?  ?Date:  01/16/2022  ?  ?ID:  [name], DOB Sep 21, 1955, MRN [id number] ?  ?PCP:  Sueanne Margarita, DO    ?[facility] Cardiologist:  Mertie Moores, MD  ?[facility] Electrophysiologist:  Vickie Epley, MD  ?  ?Referring MD: Thayer Headings, MD  ?  ?Chief Complaint: Consult for AF ablation ?  ?History of Present Illness:   ?  ?Timothy Cooley is a 66 y.o. male who presents for an evaluation of possible AF ablation at the request of Dr. Acie Fredrickson. Their medical history includes persistent atrial fibrillation, hypertension, hyperlipidemia, TIA, HFpEF, and aortic atherosclerosis. ?  ?He was seen by Dr. Acie Fredrickson 09/19/2021 and noted to be in persistent atrial fibrillation. He wished to further discuss possible ablation with EP. ?  ?Overall, he is feeling okay. He is unable to appreciate any symptoms while in atrial fibrillation. He denies any known triggers. Generally, his heart rate has consistently ranged 75-100 bpm. ?  ?Normally he is very active. Earlier today he had a 1 hour workout with a Physiological scientist. ?  ?He denies any palpitations, chest pain, shortness of breath, or peripheral edema. No lightheadedness, headaches, syncope, orthopnea, or PND. ?  ?He is very motivated to get back into normal rhythm. ?He was previously seen by Jeanie Sewer at [place].  I have reviewed their notes from the October 26 consult.  At that visit, antiarrhythmic drug therapy and catheter ablation were offered. ?  ? Plan for PVI today. Procedure reviewed. ?  ?  ?Objective  ?  ?    ?Past Medical History:  ?Diagnosis Date  ? (HFpEF) heart failure with preserved ejection fraction ([place])    ?  EF 60-65, no RWMA, normal RVSF, mild LAE, mild dilation of ascending aorta (41 mm)  ? Aortic atherosclerosis ([place])    ?  CT in 01/2021  ? Coronary artery calcification seen on CT scan    ?  CT 5/22: Ca2+ score 559 - 84th percentile  ? History of TIA (transient ischemic attack) 2015  ?  Hyperlipidemia    ? Hypertension    ? Persistent atrial fibrillation ([place])    ? Prediabetes    ?  ?  ?     ?Past Surgical History:  ?Procedure Laterality Date  ? APPLICATION OF ROBOTIC ASSISTANCE FOR SPINAL PROCEDURE N/A 10/08/2018  ?  Procedure: APPLICATION OF ROBOTIC ASSISTANCE FOR SPINAL PROCEDURE;  Surgeon: Kristeen Miss, MD;  Location: [place];  Service: Neurosurgery;  Laterality: N/A;  ? CARDIOVERSION N/A 03/11/2021  ?  Procedure: CARDIOVERSION;  Surgeon: Geralynn Rile, MD;  Location: [place];  Service: Cardiovascular;  Laterality: N/A;  ? NO PAST SURGERIES      ? none      ? POSTERIOR CERVICAL FUSION/FORAMINOTOMY N/A 10/08/2018  ?  Procedure: Cervical Seven to Thoracic One laminectomy/foraminotomy with posterior fusion Cervical Seven to Thoracic One using Mazor;  Surgeon: Kristeen Miss, MD;  Location: [place];  Service: Neurosurgery;  Laterality: N/A;  Cervical Seven to Thoracic One laminectomy/foraminotomy with posterior fusion Cervical Seven to Thoracic One using Mazor  ?  ?  ?Current Medications: ?Active Medications  ?    ?Current Meds  ?Medication Sig  ? amitriptyline (ELAVIL) 25 MG tablet Take 50 mg by mouth at bedtime.  ? amoxicillin (AMOXIL) 500 MG capsule Take 500 mg by mouth 3 (three) times daily.  ? apixaban (ELIQUIS) 5 MG TABS tablet TAKE 1 TABLET BY MOUTH TWICE  A DAY  ? atorvastatin (LIPITOR) 40 MG tablet Take 40 mg by mouth in the morning.  ? Docusate Sodium (DULCOLAX STOOL SOFTENER PO) Take by mouth.  ? losartan (COZAAR) 100 MG tablet Take 100 mg by mouth in the morning.  ? metoprolol tartrate (LOPRESSOR) 50 MG tablet Take 1 tablet (50 mg total) by mouth Once PRN (IF your heart rate is over 75 two hours before you scan.).  ? Multiple Vitamin (MULTIVITAMIN WITH MINERALS) TABS tablet Take 1 tablet by mouth daily. Adult 50+  ?  ?  ?  ?Allergies:   Patient has no known allergies.  ?  ?Social History  ?  ?     ?Socioeconomic History  ? Marital status: Married  ?    Spouse name: Not on file  ?  Number of children: Not on file  ? Years of education: Not on file  ? Highest education level: Not on file  ?Occupational History  ? Not on file  ?Tobacco Use  ? Smoking status: Never  ? Smokeless tobacco: Never  ?Vaping Use  ? Vaping Use: Never used  ?Substance and Sexual Activity  ? Alcohol use: Yes  ?    Alcohol/week: 14.0 standard drinks  ?    Types: 14 Glasses of wine per week  ?    Comment: 2 glasses daily  ? Drug use: No  ? Sexual activity: Yes  ?Other Topics Concern  ? Not on file  ?Social History Narrative  ? Not on file  ?  ?Social Determinants of Health  ?  ?Financial Resource Strain: Not on file  ?Food Insecurity: Not on file  ?Transportation Needs: Not on file  ?Physical Activity: Not on file  ?Stress: Not on file  ?Social Connections: Not on file  ?  ?  ?Family History: ?The patient's family history includes Heart Problems in his mother. ?  ?ROS:   ?Please see the history of present illness.    ?All other systems reviewed and are negative. ?  ?EKGs/Labs/Other Studies Reviewed:   ?  ?The following studies were reviewed today: ?  ?Lexiscan Myoview 02/11/2021: ?Nuclear stress EF: 57%. ?The left ventricular ejection fraction is normal (55-65%). ?There was no ST segment deviation noted during stress. ?No T wave inversion was noted during stress. ?The study is normal. ?This is a low risk study. ?  ?1.  Normal myocardial perfusion imaging study without evidence of ischemia or infarction. ?2.  Normal LVEF, 57%. ?3.  This is a low risk study. ?  ?Echo 01/24/2021: ? 1. Left ventricular ejection fraction, by estimation, is 60 to 65%. The  ?left ventricle has normal function. The left ventricle has no regional  ?wall motion abnormalities. Left ventricular diastolic parameters are  ?indeterminate.  ? 2. Right ventricular systolic function is normal. The right ventricular  ?size is mildly enlarged.  ? 3. Left atrial size was mildly dilated.  ? 4. The mitral valve is normal in structure. No evidence of mitral valve   ?regurgitation. No evidence of mitral stenosis.  ? 5. The aortic valve is normal in structure. Aortic valve regurgitation is  ?not visualized. No aortic stenosis is present.  ? 6. Aortic dilatation noted. There is mild dilatation of the ascending  ?aorta, measuring 41 mm.  ? 7. The inferior vena cava is normal in size with greater than 50%  ?respiratory variability, suggesting right atrial pressure of 3 mmHg.  ? ?Comparison(s): No significant change from prior study. Prior images  ?reviewed side by side.  ?  ?  Calcium Score 01/24/2021: ?FINDINGS: ?Coronary arteries: Normal origins. ?  ?Coronary Calcium Score: ?  ?Left main: 201 ?  ?Left anterior descending artery: 306 ?  ?Left circumflex artery: 22 ?  ?Right coronary artery: 30 ?  ?Total: 559 ?  ?Percentile: 84 ?  ?Pericardium: Normal. ?  ?Ascending Aorta: Dilated ascending aorta measuring 20mm ?  ?Non-cardiac: See separate report from [facility]. ?  ?IMPRESSION: ?1. Coronary calcium score of 559. This was 84th percentile for age-, ?race-, and sex-matched controls. ?  ?2.  Dilated ascending aorta measuring 29mm ?  ?EKG:   EKG is personally reviewed.  ?11/07/2021: Atrial fibrillation ?  ?  ?Recent Labs: ?12/20/2020: TSH 1.220 ?03/08/2021: BUN 18; Creatinine, Ser 1.15; Hemoglobin 15.4; Platelets 213; Potassium 5.0; Sodium 140  ?  ?Recent Lipid Panel ?Labs (Brief)  ?     ?   ?Component Value Date/Time  ?  CHOL 179 09/07/2014 0109  ?  TRIG 96 09/07/2014 0109  ?  HDL 49 09/07/2014 0109  ?  CHOLHDL 3.7 09/07/2014 0109  ?  VLDL 19 09/07/2014 0109  ?  [place] 111 (H) 09/07/2014 0109  ?  ?  ?  ?Physical Exam:   ?  ?VS:  BP 132/95   Pulse 95   Ht 6\' 2"  (1.88 m)   Wt 225 lb 6.4 oz (102.2 kg)   SpO2 98%   BMI 28.94 kg/m?    ?  ?   ?Wt Readings from Last 3 Encounters:  ?11/07/21 225 lb 6.4 oz (102.2 kg)  ?09/19/21 237 lb (107.5 kg)  ?03/23/21 235 lb 12.8 oz (107 kg)  ?  ?  ?GEN: Well nourished, well developed in no acute distress ?HEENT: Normal ?NECK: No JVD; No  carotid bruits ?LYMPHATICS: No lymphadenopathy ?CARDIAC: Irregularly irregular, no murmurs, rubs, gallops ?RESPIRATORY:  Clear to auscultation without rales, wheezing or rhonchi  ?ABDOMEN: Soft, non-tender, non-distended ?MUSCU

## 2022-01-16 NOTE — Discharge Instructions (Addendum)
Cardiac Ablation, Care After  This sheet gives you information about how to care for yourself after your procedure. Your health care provider may also give you more specific instructions. If you have problems or questions, contact your health care provider. What can I expect after the procedure? After the procedure, it is common to have: Bruising around your puncture site. Tenderness around your puncture site. Skipped heartbeats. Tiredness (fatigue).  Follow these instructions at home: Puncture site care  Follow instructions from your health care provider about how to take care of your puncture site. Make sure you: If present, leave stitches (sutures), skin glue, or adhesive strips in place. These skin closures may need to stay in place for up to 2 weeks. If adhesive strip edges start to loosen and curl up, you may trim the loose edges. Do not remove adhesive strips completely unless your health care provider tells you to do that. If a large square bandage is present, this may be removed 24 hours after surgery.  Check your puncture site every day for signs of infection. Check for: Redness, swelling, or pain. Fluid or blood. If your puncture site starts to bleed, lie down on your back, apply firm pressure to the area, and contact your health care provider. Warmth. Pus or a bad smell. A pea or small marble sized lump at the site is normal and can take up to three months to resolve.  Driving Do not drive for at least 4 days after your procedure or however long your health care provider recommends. (Do not resume driving if you have previously been instructed not to drive for other health reasons.) Do not drive or use heavy machinery while taking prescription pain medicine. Activity Avoid activities that take a lot of effort for at least 7 days after your procedure. Do not lift anything that is heavier than 5 lb (4.5 kg) for one week.  No sexual activity for 1 week.  Return to your normal  activities as told by your health care provider. Ask your health care provider what activities are safe for you. General instructions Take over-the-counter and prescription medicines only as told by your health care provider. Do not use any products that contain nicotine or tobacco, such as cigarettes and e-cigarettes. If you need help quitting, ask your health care provider. You may shower after 24 hours, but Do not take baths, swim, or use a hot tub for 1 week.  Do not drink alcohol for 24 hours after your procedure. Keep all follow-up visits as told by your health care provider. This is important. Contact a health care provider if: You have redness, mild swelling, or pain around your puncture site. You have fluid or blood coming from your puncture site that stops after applying firm pressure to the area. Your puncture site feels warm to the touch. You have pus or a bad smell coming from your puncture site. You have a fever. You have chest pain or discomfort that spreads to your neck, jaw, or arm. You are sweating a lot. You feel nauseous. You have a fast or irregular heartbeat. You have shortness of breath. You are dizzy or light-headed and feel the need to lie down. You have pain or numbness in the arm or leg closest to your puncture site. Get help right away if: Your puncture site suddenly swells. Your puncture site is bleeding and the bleeding does not stop after applying firm pressure to the area. These symptoms may represent a serious problem that is   an emergency. Do not wait to see if the symptoms will go away. Get medical help right away. Call your local emergency services (911 in the [place] Do not drive yourself to the hospital. Summary After the procedure, it is normal to have bruising and tenderness at the puncture site in your groin, neck, or forearm. Check your puncture site every day for signs of infection. Get help right away if your puncture site is bleeding and the  bleeding does not stop after applying firm pressure to the area. This is a medical emergency. This information is not intended to replace advice given to you by your health care provider. Make sure you discuss any questions you have with your health care provider.\  You have an appointment set up with the Atrial Fibrillation Clinic.  Multiple studies have shown that being followed by a dedicated atrial fibrillation clinic in addition to the standard care you receive from your other physicians improves health. We believe that enrollment in the atrial fibrillation clinic will allow us to better care for you.   The phone number to the Atrial Fibrillation Clinic is [phone]. The clinic is staffed Monday through Friday from 8:30am to 5pm.  Parking Directions: The clinic is located in the Heart and Vascular Building connected to [facility]. 1)From [address] turn on to [address] and go to the 3rd entrance  (Heart and Vascular entrance) on the right. 2)Look to the right for Heart &Vascular Parking Garage. 3)A code for the entrance is required, for June is 1004  4)Take the elevators to the 1st floor. Registration is in the room with the glass walls at the end of the hallway.  If you have any trouble parking or locating the clinic, please don't hesitate to call [phone].    

## 2022-01-16 NOTE — Anesthesia Preprocedure Evaluation (Addendum)
Anesthesia Evaluation  ?Patient identified by MRN, date of birth, ID band ?Patient awake ? ? ? ?Reviewed: ?Allergy & Precautions, NPO status , Patient's Chart, lab work & pertinent test results ? ?Airway ?Mallampati: II ? ?TM Distance: >3 FB ?Neck ROM: Full ? ? ? Dental ? ?(+) Dental Advisory Given, Caps,  ?  ?Pulmonary ?neg pulmonary ROS,  ?  ?Pulmonary exam normal ?breath sounds clear to auscultation ? ? ? ? ? ? Cardiovascular ?hypertension, Pt. on medications ?+ CAD  ?+ dysrhythmias Atrial Fibrillation  ?Rhythm:Irregular Rate:Abnormal ? ? ?  ?Neuro/Psych ?TIA  ? GI/Hepatic ?negative GI ROS, Neg liver ROS,   ?Endo/Other  ?negative endocrine ROS ? Renal/GU ?negative Renal ROS  ? ?  ?Musculoskeletal ?negative musculoskeletal ROS ?(+)  ? Abdominal ?  ?Peds ? Hematology ? ?(+) Blood dyscrasia (Eliquis), ,   ?Anesthesia Other Findings ?Day of surgery medications reviewed with the patient. ? Reproductive/Obstetrics ? ?  ? ? ? ? ? ? ? ? ? ? ? ? ? ?  ?  ? ? ? ? ? ? ? ?Anesthesia Physical ?Anesthesia Plan ? ?ASA: 3 ? ?Anesthesia Plan: General  ? ?Post-op Pain Management: Tylenol PO (pre-op)*  ? ?Induction: Intravenous ? ?PONV Risk Score and Plan: 2 and Midazolam, Dexamethasone and Ondansetron ? ?Airway Management Planned: Oral ETT ? ?Additional Equipment:  ? ?Intra-op Plan:  ? ?Post-operative Plan: Extubation in OR ? ?Informed Consent: I have reviewed the patients History and Physical, chart, labs and discussed the procedure including the risks, benefits and alternatives for the proposed anesthesia with the patient or authorized representative who has indicated his/her understanding and acceptance.  ? ? ? ?Dental advisory given ? ?Plan Discussed with: CRNA ? ?Anesthesia Plan Comments:   ? ? ? ? ? ? ?Anesthesia Quick Evaluation ? ?

## 2022-01-17 ENCOUNTER — Encounter (HOSPITAL_COMMUNITY): Payer: Self-pay | Admitting: Cardiology

## 2022-01-20 DIAGNOSIS — L03119 Cellulitis of unspecified part of limb: Secondary | ICD-10-CM | POA: Diagnosis not present

## 2022-01-20 DIAGNOSIS — S91331A Puncture wound without foreign body, right foot, initial encounter: Secondary | ICD-10-CM | POA: Diagnosis not present

## 2022-01-20 DIAGNOSIS — W450XXA Nail entering through skin, initial encounter: Secondary | ICD-10-CM | POA: Diagnosis not present

## 2022-01-22 ENCOUNTER — Encounter: Payer: Self-pay | Admitting: Cardiology

## 2022-02-07 ENCOUNTER — Encounter (HOSPITAL_COMMUNITY): Payer: Self-pay | Admitting: Physician Assistant

## 2022-02-07 ENCOUNTER — Ambulatory Visit (HOSPITAL_COMMUNITY)
Admission: RE | Admit: 2022-02-07 | Discharge: 2022-02-07 | Disposition: A | Discharge status: Home / Self Care | Payer: Medicare Other | Source: Ambulatory Visit | Attending: Physician Assistant | Admitting: Physician Assistant

## 2022-02-07 VITALS — BP 136/86 | HR 75 | Ht 74.0 in | Wt 232.0 lb

## 2022-02-07 DIAGNOSIS — I110 Hypertensive heart disease with heart failure: Secondary | ICD-10-CM | POA: Diagnosis not present

## 2022-02-07 DIAGNOSIS — E785 Hyperlipidemia, unspecified: Secondary | ICD-10-CM | POA: Insufficient documentation

## 2022-02-07 DIAGNOSIS — Z8249 Family history of ischemic heart disease and other diseases of the circulatory system: Secondary | ICD-10-CM | POA: Diagnosis not present

## 2022-02-07 DIAGNOSIS — Z86711 Personal history of pulmonary embolism: Secondary | ICD-10-CM | POA: Diagnosis not present

## 2022-02-07 DIAGNOSIS — I4819 Other persistent atrial fibrillation: Secondary | ICD-10-CM

## 2022-02-07 DIAGNOSIS — Z8673 Personal history of transient ischemic attack (TIA), and cerebral infarction without residual deficits: Secondary | ICD-10-CM | POA: Insufficient documentation

## 2022-02-07 DIAGNOSIS — I2510 Atherosclerotic heart disease of native coronary artery without angina pectoris: Secondary | ICD-10-CM | POA: Diagnosis not present

## 2022-02-07 DIAGNOSIS — Z7901 Long term (current) use of anticoagulants: Secondary | ICD-10-CM | POA: Diagnosis not present

## 2022-02-07 DIAGNOSIS — I5032 Chronic diastolic (congestive) heart failure: Secondary | ICD-10-CM | POA: Insufficient documentation

## 2022-02-07 DIAGNOSIS — D6869 Other thrombophilia: Secondary | ICD-10-CM

## 2022-02-07 NOTE — Progress Notes (Signed)
Primary Care Physician: Charlane Ferretti, DO Primary Cardiologist: Dr Elease Hashimoto Primary Electrophysiologist: Dr Lalla Brothers Referring Physician: Tereso Newcomer PA   Timothy Cooley is a 66 y.o. male with a history of prior TIA, prior PE, chronic diastolic CHF, HTN, HLD, CAD, and atrial fibrillation who presents for follow up in the [facility].  The patient was initially diagnosed with atrial fibrillation 12/20/20 after presenting to Dr Harvie Bridge office for routine follow up. It was unclear how symptomatic he was but he did have some dyspnea with activity and fatigue. Patient is on Eliquis for a CHADS2VASC score of 6. He underwent DCCV on 03/11/21. Unfortunately, he was back out of rhythm 4-5 days later, confirmed by his smart watch. He did not feel much different in SR.   On follow up today, patient is s/p afib ablation with Dr Lalla Brothers on 01/16/22. Patient reports that he has done well since the procedure. He has not seen any afib on his smarty watch. He denies any bleeding issues on anticoagulation. He continues to drink wine daily.   Today, he denies symptoms of palpitations, chest pain, shortness of breath, orthopnea, PND, lower extremity edema, dizziness, presyncope, syncope, snoring, daytime somnolence, bleeding, or neurologic sequela. The patient is tolerating medications without difficulties and is otherwise without complaint today.    Atrial Fibrillation Risk Factors:  he does not have symptoms or diagnosis of sleep apnea. he does not have a history of rheumatic fever. he does have a history of alcohol use. The patient does not have a history of early familial atrial fibrillation or other arrhythmias.  he has a BMI of Body mass index is 29.79 kg/m.Marland Kitchen Filed Weights   02/07/22 0839  Weight: 105.2 kg     Family History  Problem Relation Age of Onset   Heart Problems Mother      Atrial Fibrillation Management history:  Previous antiarrhythmic drugs:  none Previous cardioversions: 03/11/21 Previous ablations: 01/16/22 CHADS2VASC score: 6 Anticoagulation history: Eliquis   Past Medical History:  Diagnosis Date   (HFpEF) heart failure with preserved ejection fraction ([facility])    EF 60-65, no RWMA, normal RVSF, mild LAE, mild dilation of ascending aorta (41 mm)   Aortic atherosclerosis ([facility])    CT in 01/2021   Coronary artery calcification seen on CT scan    CT 5/22: Ca2+ score 559 - 84th percentile   History of TIA (transient ischemic attack) 2015   Hyperlipidemia    Hypertension    Persistent atrial fibrillation ([facility])    Prediabetes    Past Surgical History:  Procedure Laterality Date   APPLICATION OF ROBOTIC ASSISTANCE FOR SPINAL PROCEDURE N/A 10/08/2018   Procedure: APPLICATION OF ROBOTIC ASSISTANCE FOR SPINAL PROCEDURE;  Surgeon: Barnett Abu, MD;  Location: [facility] OR;  Service: Neurosurgery;  Laterality: N/A;   ATRIAL FIBRILLATION ABLATION N/A 01/16/2022   Procedure: ATRIAL FIBRILLATION ABLATION;  Surgeon: Lanier Prude, MD;  Location: [facility] INVASIVE CV LAB;  Service: Cardiovascular;  Laterality: N/A;   CARDIOVERSION N/A 03/11/2021   Procedure: CARDIOVERSION;  Surgeon: Sande Rives, MD;  Location: [facility] ENDOSCOPY;  Service: Cardiovascular;  Laterality: N/A;   NO PAST SURGERIES     none     POSTERIOR CERVICAL FUSION/FORAMINOTOMY N/A 10/08/2018   Procedure: Cervical Seven to Thoracic One laminectomy/foraminotomy with posterior fusion Cervical Seven to Thoracic One using Mazor;  Surgeon: Barnett Abu, MD;  Location: [facility] OR;  Service: Neurosurgery;  Laterality: N/A;  Cervical Seven to Thoracic One laminectomy/foraminotomy with posterior fusion  Cervical Seven to Thoracic One using Mazor    Current Outpatient Medications  Medication Sig Dispense Refill   amitriptyline (ELAVIL) 25 MG tablet Take 50 mg by mouth at bedtime.     apixaban (ELIQUIS) 5 MG TABS tablet TAKE 1 TABLET BY MOUTH TWICE A DAY 180 tablet 1   atorvastatin (LIPITOR) 40  MG tablet Take 40 mg by mouth in the morning.     losartan (COZAAR) 100 MG tablet Take 100 mg by mouth in the morning.     Multiple Vitamin (MULTIVITAMIN WITH MINERALS) TABS tablet Take 1 tablet by mouth daily. Adult 50+     pantoprazole (PROTONIX) 40 MG tablet Take 1 tablet (40 mg total) by mouth daily. 45 tablet 0   No current facility-administered medications for this encounter.    No Known Allergies  Social History   Socioeconomic History   Marital status: Married    Spouse name: Not on file   Number of children: Not on file   Years of education: Not on file   Highest education level: Not on file  Occupational History   Not on file  Tobacco Use   Smoking status: Never   Smokeless tobacco: Never   Tobacco comments:    Never smoke 02/07/22  Vaping Use   Vaping Use: Never used  Substance and Sexual Activity   Alcohol use: Yes    Alcohol/week: 14.0 standard drinks    Types: 14 Glasses of wine per week    Comment: 2 glasses daily 02/07/22   Drug use: No   Sexual activity: Yes  Other Topics Concern   Not on file  Social History Narrative   Not on file   Social Determinants of Health   Financial Resource Strain: Not on file  Food Insecurity: Not on file  Transportation Needs: Not on file  Physical Activity: Not on file  Stress: Not on file  Social Connections: Not on file  Intimate Partner Violence: Not on file     ROS- All systems are reviewed and negative except as per the HPI above.  Physical Exam: Vitals:   02/07/22 0839  BP: 136/86  Pulse: 75  Weight: 105.2 kg  Height: 6\' 2"  (1.88 m)     GEN- The patient is a well appearing male, alert and oriented x 3 today.   HEENT-head normocephalic, atraumatic, sclera clear, conjunctiva pink, hearing intact, trachea midline. Lungs- Clear to ausculation bilaterally, normal work of breathing Heart- Regular rate and rhythm, no murmurs, rubs or gallops  GI- soft, NT, ND, + BS Extremities- no clubbing, cyanosis, or  edema MS- no significant deformity or atrophy Skin- no rash or lesion Psych- euthymic mood, full affect Neuro- strength and sensation are intact   Wt Readings from Last 3 Encounters:  02/07/22 105.2 kg  01/16/22 102.1 kg  11/07/21 102.2 kg    EKG today demonstrates  SR Vent. rate 75 BPM PR interval 178 ms QRS duration 92 ms QT/QTcB 378/422 ms  Echo 01/24/21 demonstrated  1. Left ventricular ejection fraction, by estimation, is 60 to 65%. The  left ventricle has normal function. The left ventricle has no regional  wall motion abnormalities. Left ventricular diastolic parameters are  indeterminate.   2. Right ventricular systolic function is normal. The right ventricular  size is mildly enlarged.   3. Left atrial size was mildly dilated.   4. The mitral valve is normal in structure. No evidence of mitral valve  regurgitation. No evidence of mitral stenosis.   5. The  aortic valve is normal in structure. Aortic valve regurgitation is  not visualized. No aortic stenosis is present.   6. Aortic dilatation noted. There is mild dilatation of the ascending  aorta, measuring 41 mm.   7. The inferior vena cava is normal in size with greater than 50%  respiratory variability, suggesting right atrial pressure of 3 mmHg.   Comparison(s): No significant change from prior study. Prior images  reviewed side by side.   Epic records are reviewed at length today  CHA2DS2-VASc Score = 6  The patient's score is based upon: CHF History: 1 HTN History: 1 Diabetes History: 0 Stroke History: 2 Vascular Disease History: 1 Age Score: 1 Gender Score: 0       ASSESSMENT AND PLAN: 1. Persistent Atrial Fibrillation (ICD10:  I48.19) The patient's CHA2DS2-VASc score is 6, indicating a 9.7% annual risk of stroke.   S/p afib ablation 01/16/22 Patient appears to be maintaining SR. Continue Eliquis 5 mg BID with no missed doses for 3 months post ablation.  Apple Watch for home monitoring.   2.  Secondary Hypercoagulable State (ICD10:  D68.69) The patient is at significant risk for stroke/thromboembolism based upon his CHA2DS2-VASc Score of 6.  Continue Apixaban (Eliquis).   3. HTN Stable, no changes today.  4. CAD/aortic atherosclerosis No anginal symptoms.    Follow up with Dr Quentin Ore as scheduled.    [facility] [address] [place], [place] [address] [phone] 02/07/2022 8:59 AM

## 2022-02-13 ENCOUNTER — Ambulatory Visit (HOSPITAL_COMMUNITY): Payer: Medicare Other | Admitting: Physician Assistant

## 2022-03-15 DIAGNOSIS — D3000 Benign neoplasm of unspecified kidney: Secondary | ICD-10-CM | POA: Diagnosis not present

## 2022-03-15 DIAGNOSIS — N401 Enlarged prostate with lower urinary tract symptoms: Secondary | ICD-10-CM | POA: Diagnosis not present

## 2022-03-15 DIAGNOSIS — R3914 Feeling of incomplete bladder emptying: Secondary | ICD-10-CM | POA: Diagnosis not present

## 2022-03-24 ENCOUNTER — Encounter: Payer: Self-pay | Admitting: Cardiology

## 2022-03-24 MED ORDER — APIXABAN 5 MG PO TABS: 5.0000 mg | ORAL_TABLET | Freq: Two times a day (BID) | ORAL | 0 refills | Status: DC

## 2022-04-17 ENCOUNTER — Ambulatory Visit: Payer: Medicare Other | Admitting: Cardiology

## 2022-05-16 ENCOUNTER — Ambulatory Visit: Payer: Medicare Other | Admitting: Cardiology

## 2022-05-19 DIAGNOSIS — G959 Disease of spinal cord, unspecified: Secondary | ICD-10-CM | POA: Diagnosis not present

## 2022-05-19 DIAGNOSIS — I10 Essential (primary) hypertension: Secondary | ICD-10-CM | POA: Diagnosis not present

## 2022-05-19 DIAGNOSIS — R059 Cough, unspecified: Secondary | ICD-10-CM | POA: Diagnosis not present

## 2022-05-19 DIAGNOSIS — J069 Acute upper respiratory infection, unspecified: Secondary | ICD-10-CM | POA: Diagnosis not present

## 2022-05-24 ENCOUNTER — Ambulatory Visit: Payer: Medicare Other | Attending: Cardiology | Admitting: Cardiology

## 2022-05-24 ENCOUNTER — Encounter: Payer: Self-pay | Admitting: Cardiology

## 2022-05-24 VITALS — BP 122/78 | HR 69 | Ht 74.0 in | Wt 228.0 lb

## 2022-05-24 DIAGNOSIS — I5032 Chronic diastolic (congestive) heart failure: Secondary | ICD-10-CM | POA: Diagnosis not present

## 2022-05-24 DIAGNOSIS — I2510 Atherosclerotic heart disease of native coronary artery without angina pectoris: Secondary | ICD-10-CM | POA: Diagnosis not present

## 2022-05-24 DIAGNOSIS — I4819 Other persistent atrial fibrillation: Secondary | ICD-10-CM | POA: Insufficient documentation

## 2022-05-24 DIAGNOSIS — I10 Essential (primary) hypertension: Secondary | ICD-10-CM | POA: Diagnosis not present

## 2022-05-24 NOTE — Progress Notes (Signed)
Electrophysiology Office Follow up Visit Note:    Date:  05/24/2022   ID:  Timothy Cooley, DOB [birth year], MRN [id number]  PCP:  Charlane Ferretti, DO  [facility] Cardiologist:  Kristeen Miss, MD  [facility] Electrophysiologist:  Lanier Prude, MD    Interval History:    Timothy Cooley is a 66 y.o. male who presents for a follow up visit. They were last seen in clinic 11/07/2021. He underwent successful atrial fibrillation ablation 01/16/2022.  He followed up with [name], PA on 02/07/2022 where he was doing well since his ablation. No Afib detected by his smart watch. He continued to drink wine daily. His EKG showed SR at 75 bpm. Continued Eliquis 5 mg BID with no missed doses for 3 months post ablation.  Today, he states that his Apple watch has not detected any recurring arrhythmias. Reportedly, on average his heart rate is down by 10 bpm.  He denies having any prior TIAs. He states that the event in question was found to be related to an inner ear issue.  After his prior back surgery he developed a subsequent blood clot.  He denies any palpitations, chest pain, shortness of breath, or peripheral edema. No lightheadedness, headaches, syncope, orthopnea, or PND.      Past Medical History:  Diagnosis Date   (HFpEF) heart failure with preserved ejection fraction (HCC)    EF 60-65, no RWMA, normal RVSF, mild LAE, mild dilation of ascending aorta (41 mm)   Aortic atherosclerosis (HCC)    CT in 01/2021   Coronary artery calcification seen on CT scan    CT 5/22: Ca2+ score 559 - 84th percentile   History of TIA (transient ischemic attack) 2015   Hyperlipidemia    Hypertension    Persistent atrial fibrillation (HCC)    Prediabetes     Past Surgical History:  Procedure Laterality Date   APPLICATION OF ROBOTIC ASSISTANCE FOR SPINAL PROCEDURE N/A 10/08/2018   Procedure: APPLICATION OF ROBOTIC ASSISTANCE FOR SPINAL PROCEDURE;  Surgeon: Barnett Abu, MD;  [facility] OR;  Service: Neurosurgery;  Laterality: N/A;   ATRIAL FIBRILLATION ABLATION N/A 01/16/2022   Procedure: ATRIAL FIBRILLATION ABLATION;  Surgeon: Lanier Prude, MD;  [facility]: [facility] INVASIVE CV LAB;  Service: Cardiovascular;  Laterality: N/A;   CARDIOVERSION N/A 03/11/2021   Procedure: CARDIOVERSION;  Surgeon: Sande Rives, MD;  Location: [facility] ENDOSCOPY;  Service: Cardiovascular;  Laterality: N/A;   NO PAST SURGERIES     none     POSTERIOR CERVICAL FUSION/FORAMINOTOMY N/A 10/08/2018   Procedure: Cervical Seven to Thoracic One laminectomy/foraminotomy with posterior fusion Cervical Seven to Thoracic One using Mazor;  Surgeon: Barnett Abu, MD;  Location: [facility];  Service: Neurosurgery;  Laterality: N/A;  Cervical Seven to Thoracic One laminectomy/foraminotomy with posterior fusion Cervical Seven to Thoracic One using Mazor    Current Medications: Current Meds  Medication Sig   amitriptyline (ELAVIL) 25 MG tablet Take 50 mg by mouth at bedtime.   apixaban (ELIQUIS) 5 MG TABS tablet Take 1 tablet (5 mg total) by mouth 2 (two) times daily.   atorvastatin (LIPITOR) 40 MG tablet Take 40 mg by mouth in the morning.   losartan (COZAAR) 100 MG tablet Take 100 mg by mouth in the morning.   Multiple Vitamin (MULTIVITAMIN WITH MINERALS) TABS tablet Take 1 tablet by mouth daily. Adult 50+     Allergies:   Patient has no known allergies.   Social History   Socioeconomic History  Marital status: Married    Spouse name: Not on file   Number of children: Not on file   Years of education: Not on file   Highest education level: Not on file  Occupational History   Not on file  Tobacco Use   Smoking status: Never   Smokeless tobacco: Never   Tobacco comments:    Never smoke 02/07/22  Vaping Use   Vaping Use: Never used  Substance and Sexual Activity   Alcohol use: Yes    Alcohol/week: 14.0 standard drinks of alcohol    Types: 14 Glasses of wine per week    Comment: 2 glasses daily  02/07/22   Drug use: No   Sexual activity: Yes  Other Topics Concern   Not on file  Social History Narrative   Not on file   Social Determinants of Health   Financial Resource Strain: Not on file  Food Insecurity: Not on file  Transportation Needs: Not on file  Physical Activity: Not on file  Stress: Not on file  Social Connections: Not on file     Family History: The patient's family history includes Heart Problems in his mother.  ROS:   Please see the history of present illness.    All other systems reviewed and are negative.  EKGs/Labs/Other Studies Reviewed:    The following studies were reviewed today:  01/16/2022  Atrial Fibrillation Ablation: CONCLUSIONS: 1. Successful PVI 2. Successful ablation/isolation of the posterior wall 3. Intracardiac echo reveals trivial pericardial effusion, normal LA architecture 4. No early apparent complications. 5. Colchicine 0.6 PO BID x 5 days 6. Protonix 40mg  PO daily x 45 days   EKG:  EKG is personally reviewed.  05/24/2022:  sinus rhythm, PVC  Recent Labs: 12/26/2021: BUN 17; Creatinine, Ser 1.12; Hemoglobin 14.7; Platelets 187; Potassium 4.6; Sodium 136   Recent Lipid Panel    Component Value Date/Time   CHOL 179 09/07/2014 0109   TRIG 96 09/07/2014 0109   HDL 49 09/07/2014 0109   CHOLHDL 3.7 09/07/2014 0109   VLDL 19 09/07/2014 0109   LDLCALC 111 (H) 09/07/2014 0109    Physical Exam:    VS:  BP 122/78   Pulse 69   Ht 6\' 2"  (1.88 m)   Wt 228 lb (103.4 kg)   SpO2 97%   BMI 29.27 kg/m     Wt Readings from Last 3 Encounters:  05/24/22 228 lb (103.4 kg)  02/07/22 232 lb (105.2 kg)  01/16/22 225 lb (102.1 kg)     GEN: Well nourished, well developed in no acute distress HEENT: Normal NECK: No JVD; No carotid bruits LYMPHATICS: No lymphadenopathy CARDIAC: RRR, no murmurs, rubs, gallops RESPIRATORY:  Clear to auscultation without rales, wheezing or rhonchi  ABDOMEN: Soft, non-tender,  non-distended MUSCULOSKELETAL:  No edema; No deformity  SKIN: Warm and dry NEUROLOGIC:  Alert and oriented x 3 PSYCHIATRIC:  Normal affect        ASSESSMENT:    1. Persistent atrial fibrillation (HCC)   2. Primary hypertension   3. Coronary artery calcification seen on CT scan   4. Chronic diastolic heart failure (HCC)    PLAN:    In order of problems listed above:  #Persistent AF Doing well after his ablation without recurrence. He is currently taking apixaban for stroke ppx after the ablation. He is very interested in stopping anticoagulation. I reviewed his CHADSVASC score during today's appointment. His score is at least 2 for age and HTN and could be as high as  3 for the significant amount of coronary artery calcification of his chest CT. We discussed the associated stroke risk. We discussed the lack of data supporting discontinuation of the anticoagulation despite a seemingly successful AF ablation. After our discussion, the patient would like to stop his anticoagulation. I did ask him to continue it for at least 6 months after the ablation and he is agreeable. When he stops the [facility] he will start aspirin 81mg  PO daily.  #HTN Controlled. Continue losartan.  Follow up 1 year with APP.  Medication Adjustments/Labs and Tests Ordered: Current medicines are reviewed at length with the patient today.  Concerns regarding medicines are outlined above.   No orders of the defined types were placed in this encounter.  No orders of the defined types were placed in this encounter.  I,Mathew Stumpf,acting as a for Neurosurgeon, MD.,have documented all relevant documentation on the behalf of Lanier Prude, MD,as directed by  Lanier Prude, MD while in the presence of Lanier Prude, MD.  I, Lanier Prude, MD, have reviewed all documentation for this visit. The documentation on 05/24/22 for the exam, diagnosis, procedures, and orders are all accurate and  complete.   Signed, 05/26/22, MD, [facility], [facility] 05/24/2022 10:27 PM    Electrophysiology [facility]

## 2022-05-24 NOTE — Patient Instructions (Signed)
Medication Instructions:  Stop Eliquis on Nov 6 Start Aspirin on Nov 7 *If you need a refill on your cardiac medications before your next appointment, please call your pharmacy*   Lab Work: none If you have labs (blood work) drawn today and your tests are completely normal, you will receive your results only by: MyChart Message (if you have MyChart) OR A paper copy in the mail If you have any lab test that is abnormal or we need to change your treatment, we will call you to review the results.   Testing/Procedures: none   Follow-Up: At [facility], you and your health needs are our priority.  As part of our continuing mission to provide you with exceptional heart care, we have created designated Provider Care Teams.  These Care Teams include your primary Cardiologist (physician) and Advanced Practice Providers (APPs -  Physician Assistants and Nurse Practitioners) who all work together to provide you with the care you need, when you need it.  We recommend signing up for the patient portal called "MyChart".  Sign up information is provided on this After Visit Summary.  MyChart is used to connect with patients for Virtual Visits (Telemedicine).  Patients are able to view lab/test results, encounter notes, upcoming appointments, etc.  Non-urgent messages can be sent to your provider as well.   To learn more about what you can do with MyChart, go to [email or web address].    Your next appointment:   1 year(s)  The format for your next appointment:   In Person  Provider:   You will see one of the following Advanced Practice Providers on your designated Care Team:   Francis Dowse, [place] Casimiro Needle "Mardelle Matte" Lanna Poche, [place]      Other Instructions none  Important Information About Sugar

## 2022-05-28 ENCOUNTER — Encounter: Payer: Self-pay | Admitting: Cardiovascular Disease

## 2022-07-18 ENCOUNTER — Encounter: Payer: Self-pay | Admitting: *Deleted

## 2022-07-18 ENCOUNTER — Other Ambulatory Visit: Payer: Self-pay | Admitting: *Deleted

## 2022-07-18 DIAGNOSIS — M545 Low back pain, unspecified: Secondary | ICD-10-CM | POA: Diagnosis not present

## 2022-07-18 MED ORDER — ASPIRIN 81 MG PO TBEC: 81.0000 mg | DELAYED_RELEASE_TABLET | Freq: Every day | ORAL | 3 refills | Status: AC

## 2022-07-21 DIAGNOSIS — M545 Low back pain, unspecified: Secondary | ICD-10-CM | POA: Diagnosis not present

## 2022-07-31 DIAGNOSIS — M545 Low back pain, unspecified: Secondary | ICD-10-CM | POA: Diagnosis not present

## 2022-08-04 DIAGNOSIS — E785 Hyperlipidemia, unspecified: Secondary | ICD-10-CM | POA: Diagnosis not present

## 2022-08-04 DIAGNOSIS — I10 Essential (primary) hypertension: Secondary | ICD-10-CM | POA: Diagnosis not present

## 2022-08-04 DIAGNOSIS — Z125 Encounter for screening for malignant neoplasm of prostate: Secondary | ICD-10-CM | POA: Diagnosis not present

## 2022-08-09 DIAGNOSIS — Z23 Encounter for immunization: Secondary | ICD-10-CM | POA: Diagnosis not present

## 2022-08-09 DIAGNOSIS — E663 Overweight: Secondary | ICD-10-CM | POA: Diagnosis not present

## 2022-08-09 DIAGNOSIS — E785 Hyperlipidemia, unspecified: Secondary | ICD-10-CM | POA: Diagnosis not present

## 2022-08-09 DIAGNOSIS — N3941 Urge incontinence: Secondary | ICD-10-CM | POA: Diagnosis not present

## 2022-08-09 DIAGNOSIS — I10 Essential (primary) hypertension: Secondary | ICD-10-CM | POA: Diagnosis not present

## 2022-08-09 DIAGNOSIS — Z0000 Encounter for general adult medical examination without abnormal findings: Secondary | ICD-10-CM | POA: Diagnosis not present

## 2022-08-09 DIAGNOSIS — Z86711 Personal history of pulmonary embolism: Secondary | ICD-10-CM | POA: Diagnosis not present

## 2022-08-09 DIAGNOSIS — I480 Paroxysmal atrial fibrillation: Secondary | ICD-10-CM | POA: Diagnosis not present

## 2022-08-09 DIAGNOSIS — Z1331 Encounter for screening for depression: Secondary | ICD-10-CM | POA: Diagnosis not present

## 2022-08-09 DIAGNOSIS — R7303 Prediabetes: Secondary | ICD-10-CM | POA: Diagnosis not present

## 2022-08-09 DIAGNOSIS — I719 Aortic aneurysm of unspecified site, without rupture: Secondary | ICD-10-CM | POA: Diagnosis not present

## 2022-08-09 DIAGNOSIS — Z1389 Encounter for screening for other disorder: Secondary | ICD-10-CM | POA: Diagnosis not present

## 2022-10-26 DIAGNOSIS — Z86711 Personal history of pulmonary embolism: Secondary | ICD-10-CM | POA: Diagnosis not present

## 2022-10-26 DIAGNOSIS — H1131 Conjunctival hemorrhage, right eye: Secondary | ICD-10-CM | POA: Diagnosis not present

## 2022-10-26 DIAGNOSIS — I480 Paroxysmal atrial fibrillation: Secondary | ICD-10-CM | POA: Diagnosis not present

## 2022-10-26 DIAGNOSIS — I719 Aortic aneurysm of unspecified site, without rupture: Secondary | ICD-10-CM | POA: Diagnosis not present

## 2022-10-26 DIAGNOSIS — R7303 Prediabetes: Secondary | ICD-10-CM | POA: Diagnosis not present

## 2022-10-26 DIAGNOSIS — E663 Overweight: Secondary | ICD-10-CM | POA: Diagnosis not present

## 2022-10-26 DIAGNOSIS — E785 Hyperlipidemia, unspecified: Secondary | ICD-10-CM | POA: Diagnosis not present

## 2022-10-26 DIAGNOSIS — I10 Essential (primary) hypertension: Secondary | ICD-10-CM | POA: Diagnosis not present

## 2022-10-27 DIAGNOSIS — H1131 Conjunctival hemorrhage, right eye: Secondary | ICD-10-CM | POA: Diagnosis not present

## 2023-03-09 DIAGNOSIS — H9193 Unspecified hearing loss, bilateral: Secondary | ICD-10-CM | POA: Diagnosis not present

## 2023-04-18 DIAGNOSIS — R200 Anesthesia of skin: Secondary | ICD-10-CM | POA: Diagnosis not present

## 2023-04-18 DIAGNOSIS — Z981 Arthrodesis status: Secondary | ICD-10-CM | POA: Diagnosis not present

## 2023-04-18 DIAGNOSIS — M546 Pain in thoracic spine: Secondary | ICD-10-CM | POA: Diagnosis not present

## 2023-05-06 ENCOUNTER — Encounter: Payer: Self-pay | Admitting: Cardiovascular Disease

## 2023-05-06 NOTE — Progress Notes (Unsigned)
Cardiology Office Note:    Date:  05/07/2023   ID:  Timothy Cooley, DOB 03/28/1956, MRN [id number]  PCP:  Charlane Ferretti, DO   [facility]  Cardiologist:  Arriyanna Mersch  Advanced Practice Provider:  No care team member to display Electrophysiologist:  Lanier Prude, MD    :[id number]}   Referring MD: Charlane Ferretti, DO   Chief Complaint  Patient presents with   Atrial Fibrillation    December 20, 2020   Timothy Cooley is a 67 y.o. male with a hx of  Pulmonary embolus, HTN I saw Timothy Cooley vis telemedicine visit in 2020  Hx of PE, chronic diastolic CHF, HTN, HLD  I saw him in June 2020 He had neck surgery in Jan. 2016  He was called to come back in for follow up visit  He is no longer on Eliquis.   ECG shows Afib with controlled Rate. Alcohol - 2 glasses of wine with dinner .  No OSA symptoms   Has numbness from his chest down due to a spinal cord.   Jan. 9, 2023: Timothy Cooley is seen today for follow up of his atrial fib Hx of PE, chronic diastlic CHF, HTN, HLD  He had DC cardioversion in July, 2022 - had early return of his Afib.  Went to Afib clinic   Has been seen by [facility] cardiology and [facility] Was offered Afib ablation.  Working out regularly .   Spin cycle Is in persistent Afib .  Works - owns Nurse, learning disability "also owns a [employer]  Needs a colonoscopy . He is at low risk for CV complication with colonoscopy  He may hold his eliquis for 2 days prior to procedure    Aug. 26, 2024  Timothy Cooley is seen for follow up of his atrial fib Hx of PE, chronic diastolic CHF, HTN, HLD   Had Afib ablation with [facility]   Still exercising regularly  No CP , no dyspnea  Does not watch his salt  Sprinkles salt on his food regularly   Wine with dinner    Past Medical History:  Diagnosis Date   (HFpEF) heart failure with preserved ejection fraction (HCC)    EF 60-65, no RWMA, normal RVSF, mild LAE, mild dilation of ascending aorta (41 mm)    Aortic atherosclerosis (HCC)    CT in 01/2021   Coronary artery calcification seen on CT scan    CT 5/22: Ca2+ score 559 - 84th percentile   History of TIA (transient ischemic attack) 2015   Hyperlipidemia    Hypertension    Persistent atrial fibrillation (HCC)    Prediabetes     Past Surgical History:  Procedure Laterality Date   APPLICATION OF ROBOTIC ASSISTANCE FOR SPINAL PROCEDURE N/A 10/08/2018   Procedure: APPLICATION OF ROBOTIC ASSISTANCE FOR SPINAL PROCEDURE;  Surgeon: Barnett Abu, MD;  Location: [facility] OR;  Service: Neurosurgery;  Laterality: N/A;   ATRIAL FIBRILLATION ABLATION N/A 01/16/2022   Procedure: ATRIAL FIBRILLATION ABLATION;  Surgeon: Lanier Prude, MD;  Location: [facility] INVASIVE CV LAB;  Service: Cardiovascular;  Laterality: N/A;   CARDIOVERSION N/A 03/11/2021   Procedure: CARDIOVERSION;  Surgeon: Sande Rives, MD;  Location: [facility] ENDOSCOPY;  Service: Cardiovascular;  Laterality: N/A;   NO PAST SURGERIES     none     POSTERIOR CERVICAL FUSION/FORAMINOTOMY N/A 10/08/2018   Procedure: Cervical Seven to Thoracic One laminectomy/foraminotomy with posterior fusion Cervical Seven to Thoracic One using Mazor;  Surgeon: Barnett Abu, MD;  Location: [facility] OR;  Service: Neurosurgery;  Laterality: N/A;  Cervical Seven to Thoracic One laminectomy/foraminotomy with posterior fusion Cervical Seven to Thoracic One using Mazor    Current Medications: Current Meds  Medication Sig   amitriptyline (ELAVIL) 25 MG tablet Take 50 mg by mouth at bedtime.   aspirin EC 81 MG tablet Take 1 tablet (81 mg total) by mouth daily. Swallow whole.   atorvastatin (LIPITOR) 40 MG tablet Take 40 mg by mouth in the morning.   losartan (COZAAR) 100 MG tablet Take 100 mg by mouth in the morning.   Multiple Vitamin (MULTIVITAMIN WITH MINERALS) TABS tablet Take 1 tablet by mouth daily. Adult 50+     Allergies:   Patient has no known allergies.   Social History   Socioeconomic History   Marital  status: Married    Spouse name: Not on file   Number of children: Not on file   Years of education: Not on file   Highest education level: Not on file  Occupational History   Not on file  Tobacco Use   Smoking status: Never   Smokeless tobacco: Never   Tobacco comments:    Never smoke 02/07/22  Vaping Use   Vaping status: Never Used  Substance and Sexual Activity   Alcohol use: Yes    Alcohol/week: 14.0 standard drinks of alcohol    Types: 14 Glasses of wine per week    Comment: 2 glasses daily 02/07/22   Drug use: No   Sexual activity: Yes  Other Topics Concern   Not on file  Social History Narrative   Not on file   Social Determinants of Health   Financial Resource Strain: Not on file  Food Insecurity: Not on file  Transportation Needs: Not on file  Physical Activity: Not on file  Stress: Not on file  Social Connections: Not on file     Family History: The patient's family history includes Heart Problems in his mother.  ROS:   Please see the history of present illness.     All other systems reviewed and are negative.  EKGs/Labs/Other Studies Reviewed:    The following studies were reviewed today:  EKG Interpretation Date/Time:  Monday May 07 2023 09:47:00 EDT Ventricular Rate:  60 PR Interval:  180 QRS Duration:  98 QT Interval:  422 QTC Calculation: 422 R Axis:   32  Text Interpretation: Normal sinus rhythm Normal ECG When compared with ECG of 07-Feb-2022 08:41, No significant change was found Confirmed by Kristeen Miss ([id number]) on 05/07/2023 10:01:28 AM     Recent Labs: No results found for requested labs within last 365 days.  Recent Lipid Panel    Component Value Date/Time   CHOL 179 09/07/2014 0109   TRIG 96 09/07/2014 0109   HDL 49 09/07/2014 0109   CHOLHDL 3.7 09/07/2014 0109   VLDL 19 09/07/2014 0109   LDLCALC 111 (H) 09/07/2014 0109     Risk Assessment/Calculations:    CHA2DS2-VASc Score =    This indicates a  % annual risk of  stroke. The patient's score is based upon:      Physical Exam:    Physical Exam: Blood pressure 138/78, pulse 60, height 6\' 1"  (1.854 m), weight 224 lb 9.6 oz (101.9 kg), SpO2 95%.      GEN:  Well nourished, well developed in no acute distress HEENT: Normal NECK: No JVD; No carotid bruits LYMPHATICS: No lymphadenopathy CARDIAC: RRR , no murmurs, rubs, gallops RESPIRATORY:  Clear to auscultation without  rales, wheezing or rhonchi  ABDOMEN: Soft, non-tender, non-distended MUSCULOSKELETAL:  No edema; No deformity  SKIN: Warm and dry NEUROLOGIC:  Alert and oriented x 3   ASSESSMENT:    1. Persistent atrial fibrillation (HCC)      PLAN:   1.  Atrial fibrillation:   s/p ablation   2.   Coronary artery calcifications: Coronary calcium score is 848 which places him in the 89th percentile for age and sex matched controls.Marland Kitchen  His LDL goal is less than 70.  He has his lipids drawn through his primary medical doctor.  3. HTN:  still uses quite a bit of salt.  Advised him to avoid salt  Continue current medications      Medication Adjustments/Labs and Tests Ordered: Current medicines are reviewed at length with the patient today.  Concerns regarding medicines are outlined above.  Orders Placed This Encounter  Procedures   EKG 12-Lead   No orders of the defined types were placed in this encounter.    There are no Patient Instructions on file for this visit.   Signed, Kristeen Miss, MD  05/07/2023 10:07 AM    [facility]

## 2023-05-07 ENCOUNTER — Ambulatory Visit: Payer: Medicare HMO | Attending: Cardiovascular Disease | Admitting: Cardiovascular Disease

## 2023-05-07 ENCOUNTER — Encounter: Payer: Self-pay | Admitting: Cardiovascular Disease

## 2023-05-07 VITALS — BP 138/78 | HR 60 | Ht 73.0 in | Wt 224.6 lb

## 2023-05-07 DIAGNOSIS — I2510 Atherosclerotic heart disease of native coronary artery without angina pectoris: Secondary | ICD-10-CM | POA: Diagnosis not present

## 2023-05-07 DIAGNOSIS — I10 Essential (primary) hypertension: Secondary | ICD-10-CM

## 2023-05-07 DIAGNOSIS — I4819 Other persistent atrial fibrillation: Secondary | ICD-10-CM

## 2023-05-07 NOTE — Patient Instructions (Addendum)
Medication Instructions:   Your physician recommends that you continue on your current medications as directed. Please refer to the Current Medication list given to you today.  *If you need a refill on your cardiac medications before your next appointment, please call your pharmacy*    Follow-Up: At [facility], you and your health needs are our priority.  As part of our continuing mission to provide you with exceptional heart care, we have created designated Provider Care Teams.  These Care Teams include your primary Cardiologist (physician) and Advanced Practice Providers (APPs -  Physician Assistants and Nurse Practitioners) who all work together to provide you with the care you need, when you need it.  We recommend signing up for the patient portal called "MyChart".  Sign up information is provided on this After Visit Summary.  MyChart is used to connect with patients for Virtual Visits (Telemedicine).  Patients are able to view lab/test results, encounter notes, upcoming appointments, etc.  Non-urgent messages can be sent to your provider as well.   To learn more about what you can do with MyChart, go to [email or web address].    Your next appointment:   1 year(s)  Provider:   Kristeen Miss, MD      Other Instructions  --Low-Sodium Eating Plan Salt (sodium) helps you keep a healthy balance of fluids in your body. Too much sodium can raise your blood pressure. It can also cause fluid and waste to be held in your body. Your health care provider or dietitian may recommend a low-sodium eating plan if you have high blood pressure (hypertension), kidney disease, liver disease, or heart failure. Eating less sodium can help lower your blood pressure and reduce swelling. It can also protect your heart, liver, and kidneys. What are tips for following this plan? Reading food labels  Check food labels for the amount of sodium per serving. If you eat more than one serving, you must  multiply the listed amount by the number of servings. Choose foods with less than 140 milligrams (mg) of sodium per serving. Avoid foods with 300 mg of sodium or more per serving. Always check how much sodium is in a product, even if the label says "unsalted" or "no salt added." Shopping  Buy products labeled as "low-sodium" or "no salt added." Buy fresh foods. Avoid canned foods and pre-made or frozen meals. Avoid canned, cured, or processed meats. Buy breads that have less than 80 mg of sodium per slice. Cooking  Eat more home-cooked food. Try to eat less restaurant, buffet, and fast food. Try not to add salt when you cook. Use salt-free seasonings or herbs instead of table salt or sea salt. Check with your provider or pharmacist before using salt substitutes. Cook with plant-based oils, such as canola, sunflower, or olive oil. Meal planning When eating at a restaurant, ask if your food can be made with less salt or no salt. Avoid dishes labeled as brined, pickled, cured, or smoked. Avoid dishes made with soy sauce, miso, or teriyaki sauce. Avoid foods that have monosodium glutamate (MSG) in them. MSG may be added to some restaurant food, sauces, soups, bouillon, and canned foods. Make meals that can be grilled, baked, poached, roasted, or steamed. These are often made with less sodium. General information Try to limit your sodium intake to 1,500-2,300 mg each day, or the amount told by your provider. What foods should I eat? Fruits Fresh, frozen, or canned fruit. Fruit juice. Vegetables Fresh or frozen vegetables. "No salt added"  canned vegetables. "No salt added" tomato sauce and paste. Low-sodium or reduced-sodium tomato and vegetable juice. Grains Low-sodium cereals, such as oats, puffed wheat and rice, and shredded wheat. Low-sodium crackers. Unsalted rice. Unsalted pasta. Low-sodium bread. Whole grain breads and whole grain pasta. Meats and other proteins Fresh or frozen meat,  poultry, seafood, and fish. These should have no added salt. Low-sodium canned tuna and salmon. Unsalted nuts. Dried peas, beans, and lentils without added salt. Unsalted canned beans. Eggs. Unsalted nut butters. Dairy Milk. Soy milk. Cheese that is naturally low in sodium, such as ricotta cheese, fresh mozzarella, or Swiss cheese. Low-sodium or reduced-sodium cheese. Cream cheese. Yogurt. Seasonings and condiments Fresh and dried herbs and spices. Salt-free seasonings. Low-sodium mustard and ketchup. Sodium-free salad dressing. Sodium-free light mayonnaise. Fresh or refrigerated horseradish. Lemon juice. Vinegar. Other foods Homemade, reduced-sodium, or low-sodium soups. Unsalted popcorn and pretzels. Low-salt or salt-free chips. The items listed above may not be all the foods and drinks you can have. Talk to a dietitian to learn more. What foods should I avoid? Vegetables Sauerkraut, pickled vegetables, and relishes. Olives. [place] fries. Onion rings. Regular canned vegetables, except low-sodium or reduced-sodium items. Regular canned tomato sauce and paste. Regular tomato and vegetable juice. Frozen vegetables in sauces. Grains Instant hot cereals. Bread stuffing, pancake, and biscuit mixes. Croutons. Seasoned rice or pasta mixes. Noodle soup cups. Boxed or frozen macaroni and cheese. Regular salted crackers. Self-rising flour. Meats and other proteins Meat or fish that is salted, canned, smoked, spiced, or pickled. Precooked or cured meat, such as sausages or meat loaves. Tomasa Blase. Ham. Pepperoni. Hot dogs. Corned beef. Chipped beef. Salt pork. Jerky. Pickled herring, anchovies, and sardines. Regular canned tuna. Salted nuts. Dairy Processed cheese and cheese spreads. Hard cheeses. Cheese curds. Blue cheese. Feta cheese. String cheese. Regular cottage cheese. Buttermilk. Canned milk. Fats and oils Salted butter. Regular margarine. Ghee. Bacon fat. Seasonings and condiments Onion salt, garlic  salt, seasoned salt, table salt, and sea salt. Canned and packaged gravies. Worcestershire sauce. Tartar sauce. Barbecue sauce. Teriyaki sauce. Soy sauce, including reduced-sodium soy sauce. Steak sauce. Fish sauce. Oyster sauce. Cocktail sauce. Horseradish that you find on the shelf. Regular ketchup and mustard. Meat flavorings and tenderizers. Bouillon cubes. Hot sauce. Pre-made or packaged marinades. Pre-made or packaged taco seasonings. Relishes. Regular salad dressings. Salsa. Other foods Salted popcorn and pretzels. Corn chips and puffs. Potato and tortilla chips. Canned or dried soups. Pizza. Frozen entrees and pot pies. The items listed above may not be all the foods and drinks you should avoid. Talk to a dietitian to learn more. This information is not intended to replace advice given to you by your health care provider. Make sure you discuss any questions you have with your health care provider. Document Revised: 09/14/2022 Document Reviewed: 09/14/2022 Elsevier Patient Education  2024 [employer].

## 2023-07-05 DIAGNOSIS — K5901 Slow transit constipation: Secondary | ICD-10-CM | POA: Diagnosis not present

## 2023-07-27 DIAGNOSIS — N3941 Urge incontinence: Secondary | ICD-10-CM | POA: Diagnosis not present

## 2023-07-27 DIAGNOSIS — K5900 Constipation, unspecified: Secondary | ICD-10-CM | POA: Diagnosis not present

## 2023-07-27 DIAGNOSIS — R350 Frequency of micturition: Secondary | ICD-10-CM | POA: Diagnosis not present

## 2023-07-27 DIAGNOSIS — M6281 Muscle weakness (generalized): Secondary | ICD-10-CM | POA: Diagnosis not present

## 2023-08-08 DIAGNOSIS — K5900 Constipation, unspecified: Secondary | ICD-10-CM | POA: Diagnosis not present

## 2023-08-08 DIAGNOSIS — R350 Frequency of micturition: Secondary | ICD-10-CM | POA: Diagnosis not present

## 2023-08-08 DIAGNOSIS — M6281 Muscle weakness (generalized): Secondary | ICD-10-CM | POA: Diagnosis not present

## 2023-08-08 DIAGNOSIS — N3941 Urge incontinence: Secondary | ICD-10-CM | POA: Diagnosis not present

## 2023-08-17 DIAGNOSIS — N3941 Urge incontinence: Secondary | ICD-10-CM | POA: Diagnosis not present

## 2023-08-17 DIAGNOSIS — M6281 Muscle weakness (generalized): Secondary | ICD-10-CM | POA: Diagnosis not present

## 2023-08-17 DIAGNOSIS — K5900 Constipation, unspecified: Secondary | ICD-10-CM | POA: Diagnosis not present

## 2023-08-17 DIAGNOSIS — E785 Hyperlipidemia, unspecified: Secondary | ICD-10-CM | POA: Diagnosis not present

## 2023-08-24 DIAGNOSIS — Z1339 Encounter for screening examination for other mental health and behavioral disorders: Secondary | ICD-10-CM | POA: Diagnosis not present

## 2023-08-24 DIAGNOSIS — N3941 Urge incontinence: Secondary | ICD-10-CM | POA: Diagnosis not present

## 2023-08-24 DIAGNOSIS — I5032 Chronic diastolic (congestive) heart failure: Secondary | ICD-10-CM | POA: Diagnosis not present

## 2023-08-24 DIAGNOSIS — I700 Atherosclerosis of aorta: Secondary | ICD-10-CM | POA: Diagnosis not present

## 2023-08-24 DIAGNOSIS — M6281 Muscle weakness (generalized): Secondary | ICD-10-CM | POA: Diagnosis not present

## 2023-08-24 DIAGNOSIS — E663 Overweight: Secondary | ICD-10-CM | POA: Diagnosis not present

## 2023-08-24 DIAGNOSIS — I480 Paroxysmal atrial fibrillation: Secondary | ICD-10-CM | POA: Diagnosis not present

## 2023-08-24 DIAGNOSIS — Z23 Encounter for immunization: Secondary | ICD-10-CM | POA: Diagnosis not present

## 2023-08-24 DIAGNOSIS — E785 Hyperlipidemia, unspecified: Secondary | ICD-10-CM | POA: Diagnosis not present

## 2023-08-24 DIAGNOSIS — M791 Myalgia, unspecified site: Secondary | ICD-10-CM | POA: Diagnosis not present

## 2023-08-24 DIAGNOSIS — I719 Aortic aneurysm of unspecified site, without rupture: Secondary | ICD-10-CM | POA: Diagnosis not present

## 2023-08-24 DIAGNOSIS — Z1331 Encounter for screening for depression: Secondary | ICD-10-CM | POA: Diagnosis not present

## 2023-08-24 DIAGNOSIS — R3589 Other polyuria: Secondary | ICD-10-CM | POA: Diagnosis not present

## 2023-08-24 DIAGNOSIS — Z0000 Encounter for general adult medical examination without abnormal findings: Secondary | ICD-10-CM | POA: Diagnosis not present

## 2023-08-24 DIAGNOSIS — I10 Essential (primary) hypertension: Secondary | ICD-10-CM | POA: Diagnosis not present

## 2023-08-30 DIAGNOSIS — M6281 Muscle weakness (generalized): Secondary | ICD-10-CM | POA: Diagnosis not present

## 2023-08-30 DIAGNOSIS — N3941 Urge incontinence: Secondary | ICD-10-CM | POA: Diagnosis not present

## 2023-08-30 DIAGNOSIS — K5900 Constipation, unspecified: Secondary | ICD-10-CM | POA: Diagnosis not present

## 2023-09-16 ENCOUNTER — Emergency Department (HOSPITAL_BASED_OUTPATIENT_CLINIC_OR_DEPARTMENT_OTHER): Payer: PPO

## 2023-09-16 ENCOUNTER — Encounter (HOSPITAL_BASED_OUTPATIENT_CLINIC_OR_DEPARTMENT_OTHER): Payer: Self-pay | Admitting: Emergency Medicine

## 2023-09-16 ENCOUNTER — Emergency Department (HOSPITAL_BASED_OUTPATIENT_CLINIC_OR_DEPARTMENT_OTHER)
Admission: EM | Admit: 2023-09-16 | Discharge: 2023-09-16 | Disposition: A | Discharge status: Home / Self Care | Payer: PPO | Attending: Emergency Medicine | Admitting: Emergency Medicine

## 2023-09-16 ENCOUNTER — Other Ambulatory Visit: Payer: Self-pay

## 2023-09-16 DIAGNOSIS — I159 Secondary hypertension, unspecified: Secondary | ICD-10-CM | POA: Insufficient documentation

## 2023-09-16 DIAGNOSIS — R110 Nausea: Secondary | ICD-10-CM | POA: Diagnosis not present

## 2023-09-16 DIAGNOSIS — I10 Essential (primary) hypertension: Secondary | ICD-10-CM | POA: Diagnosis not present

## 2023-09-16 DIAGNOSIS — Z79899 Other long term (current) drug therapy: Secondary | ICD-10-CM | POA: Insufficient documentation

## 2023-09-16 DIAGNOSIS — Z20822 Contact with and (suspected) exposure to covid-19: Secondary | ICD-10-CM | POA: Insufficient documentation

## 2023-09-16 DIAGNOSIS — Z7982 Long term (current) use of aspirin: Secondary | ICD-10-CM | POA: Insufficient documentation

## 2023-09-16 DIAGNOSIS — R079 Chest pain, unspecified: Secondary | ICD-10-CM | POA: Diagnosis not present

## 2023-09-16 LAB — CBC WITH DIFFERENTIAL/PLATELET
Abs Immature Granulocytes: 0.01 10*3/uL (ref 0.00–0.07)
Basophils Absolute: 0.1 10*3/uL (ref 0.0–0.1)
Basophils Relative: 1 %
Eosinophils Absolute: 0.3 10*3/uL (ref 0.0–0.5)
Eosinophils Relative: 4 %
HCT: 40.3 % (ref 39.0–52.0)
Hemoglobin: 13.3 g/dL (ref 13.0–17.0)
Immature Granulocytes: 0 %
Lymphocytes Relative: 31 %
Lymphs Abs: 2.2 10*3/uL (ref 0.7–4.0)
MCH: 28.9 pg (ref 26.0–34.0)
MCHC: 33 g/dL (ref 30.0–36.0)
MCV: 87.4 fL (ref 80.0–100.0)
Monocytes Absolute: 0.6 10*3/uL (ref 0.1–1.0)
Monocytes Relative: 9 %
Neutro Abs: 4 10*3/uL (ref 1.7–7.7)
Neutrophils Relative %: 55 %
Platelets: 200 10*3/uL (ref 150–400)
RBC: 4.61 MIL/uL (ref 4.22–5.81)
RDW: 14.5 % (ref 11.5–15.5)
WBC: 7.2 10*3/uL (ref 4.0–10.5)
nRBC: 0 % (ref 0.0–0.2)

## 2023-09-16 LAB — COMPREHENSIVE METABOLIC PANEL
ALT: 29 U/L (ref 0–44)
AST: 35 U/L (ref 15–41)
Albumin: 4.5 g/dL (ref 3.5–5.0)
Alkaline Phosphatase: 56 U/L (ref 38–126)
Anion gap: 7 (ref 5–15)
BUN: 17 mg/dL (ref 8–23)
CO2: 28 mmol/L (ref 22–32)
Calcium: 9.4 mg/dL (ref 8.9–10.3)
Chloride: 102 mmol/L (ref 98–111)
Creatinine, Ser: 0.95 mg/dL (ref 0.61–1.24)
GFR, Estimated: >60 mL/min (ref >60)
Glucose, Bld: 98 mg/dL (ref 70–99)
Potassium: 4.6 mmol/L (ref 3.5–5.1)
Sodium: 137 mmol/L (ref 135–145)
Total Bilirubin: 0.5 mg/dL (ref 0.0–1.2)
Total Protein: 7.1 g/dL (ref 6.5–8.1)

## 2023-09-16 LAB — RESP PANEL BY RT-PCR (RSV, FLU A&B, COVID)  RVPGX2
Influenza A by PCR: NEGATIVE
Influenza B by PCR: NEGATIVE
Resp Syncytial Virus by PCR: NEGATIVE
SARS Coronavirus 2 by RT PCR: NEGATIVE

## 2023-09-16 LAB — LIPASE, BLOOD: Lipase: 25 U/L (ref 11–51)

## 2023-09-16 LAB — TROPONIN I (HIGH SENSITIVITY): Troponin I (High Sensitivity): 3 ng/L (ref <18)

## 2023-09-16 MED ORDER — ONDANSETRON 4 MG PO TBDP: 4.0000 mg | ORAL_TABLET | Freq: Three times a day (TID) | ORAL | 0 refills | Status: AC | PRN

## 2023-09-16 NOTE — ED Provider Notes (Signed)
 [place] EMERGENCY DEPARTMENT AT [name] Provider Note   CSN: [id number] Arrival date & time: 09/16/23  1606     History  Chief Complaint  Patient presents with   Hypertension    Timothy Cooley is a 68 y.o. male.  Patient here with concern for high blood pressure, generalized weakness, nausea.  Possible sick contact with the same.  He denies any chest pain headache numbness tingling.  No stroke symptoms.  No headache.  Blood pressure has been mildly elevated today.  He denies any abdominal pain or diarrhea.  Denies any fever or chills.  Denies any vision loss speech changes.  The history is provided by the patient.       Home Medications Prior to Admission medications   Medication Sig Start Date End Date Taking? Authorizing Provider  ondansetron  (ZOFRAN -ODT) 4 MG disintegrating tablet Take 1 tablet (4 mg total) by mouth every 8 (eight) hours as needed. 09/16/23  Yes Miley Lindon, DO  amitriptyline (ELAVIL) 25 MG tablet Take 50 mg by mouth at bedtime. 01/24/21   [provider]  aspirin  EC 81 MG tablet Take 1 tablet (81 mg total) by mouth daily. Swallow whole. 07/18/22   Cindie Ole DASEN, MD  atorvastatin  (LIPITOR) 40 MG tablet Take 40 mg by mouth in the morning. 01/27/21   [provider]  losartan  (COZAAR ) 100 MG tablet Take 100 mg by mouth in the morning. 08/23/18   [provider]  Multiple Vitamin (MULTIVITAMIN WITH MINERALS) TABS tablet Take 1 tablet by mouth daily. Adult 50+    [provider]      Allergies    Patient has no known allergies.    Review of Systems   Review of Systems  Physical Exam Updated Vital Signs BP (!) 153/77   Pulse (!) 55   Temp (!) 97.5 F (36.4 C) (Oral)   Resp 14   SpO2 98%  Physical Exam Vitals and nursing note reviewed.  Constitutional:      General: He is not in acute distress.    Appearance: He is well-developed. He is not ill-appearing.  HENT:     Head: Normocephalic and  atraumatic.     Nose: Nose normal.     Mouth/Throat:     Mouth: Mucous membranes are moist.  Eyes:     Extraocular Movements: Extraocular movements intact.     Conjunctiva/sclera: Conjunctivae normal.     Pupils: Pupils are equal, round, and reactive to light.  Cardiovascular:     Rate and Rhythm: Normal rate and regular rhythm.     Pulses: Normal pulses.     Heart sounds: Normal heart sounds. No murmur heard. Pulmonary:     Effort: Pulmonary effort is normal. No respiratory distress.     Breath sounds: Normal breath sounds.  Abdominal:     General: Abdomen is flat.     Palpations: Abdomen is soft.     Tenderness: There is no abdominal tenderness.  Musculoskeletal:        General: No swelling. Normal range of motion.     Cervical back: Normal range of motion and neck supple.  Skin:    General: Skin is warm and dry.     Capillary Refill: Capillary refill takes less than 2 seconds.  Neurological:     General: No focal deficit present.     Mental Status: He is alert and oriented to person, place, and time.     Cranial Nerves: No cranial nerve deficit.  Sensory: No sensory deficit.     Motor: No weakness.     Coordination: Coordination normal.     Comments: 5+ out of 5 strength, normal sensation, no drift, normal speech, normal visual fields  Psychiatric:        Mood and Affect: Mood normal.     ED Results / Procedures / Treatments   Labs (all labs ordered are listed, but only abnormal results are displayed) Labs Reviewed  RESP PANEL BY RT-PCR (RSV, FLU A&B, COVID)  [id number]  CBC WITH DIFFERENTIAL/PLATELET  COMPREHENSIVE METABOLIC PANEL  LIPASE, BLOOD  TROPONIN I (HIGH SENSITIVITY)    EKG EKG Interpretation Date/Time:  Sunday September 16 2023 16:46:14 EST Ventricular Rate:  52 PR Interval:  207 QRS Duration:  100 QT Interval:  448 QTC Calculation: 417 R Axis:   44  Text Interpretation: Sinus rhythm Atrial premature complex Abnormal R-wave progression, early  transition Confirmed by Ruthe Cornet [phone]) on 09/16/2023 4:52:41 PM  Radiology DG Chest Portable 1 View Result Date: 09/16/2023 CLINICAL DATA:  Pain EXAM: PORTABLE CHEST 1 VIEW COMPARISON:  Chest x-ray 10/18/2018 FINDINGS: The heart size and mediastinal contours are within normal limits. Both lungs are clear. Cervical spinal fusion plate is present. IMPRESSION: No active disease. Electronically Signed   By: Greig Pique M.D.   On: 09/16/2023 16:40    Procedures Procedures    Medications Ordered in ED Medications - No data to display  ED Course/ Medical Decision Making/ A&P                                 Medical Decision Making Amount and/or Complexity of Data Reviewed Labs: ordered. Radiology: ordered.  Risk Prescription drug management.   Timothy Cooley Cooley is here with generalized weakness, nausea, high blood pressure.  Blood pressure 171/77.  Vitals otherwise unremarkable.  Some generalized weakness and nausea today noticed his blood pressure was high.  Supposedly a sick contact with some nausea vomiting syndrome as well.  But he has not vomited.  No diarrhea.  He denies any chest pain or stroke symptoms.  He is well-appearing overall.  Seems like when he eats that is when he feels nauseous.  But he has no abdominal pain.  They were concerned and wanted him to be evaluated.  He has a normal neurological exam.  Have no concern for stroke and very low concern for ACS but will check troponin basic labs chest x-ray EKG COVID flu test.  Differential could be viral process or GI illness versus less likely ACS.  Have no concern for PE.  Will look for electrolyte abnormalities, anemia.  Overall lab work shows no significant anemia electrolyte abnormality kidney injury.  Troponin is unremarkable.  No significant findings on chest x-ray.  No pneumonia or pneumothorax.  Viral testing negative.  I do think that this could be a GI illness.  Will give him Zofran  for home.  Will have him continue  to follow his blood pressure at home.  His blood pressure is 152/88 prior to discharge.  Overall discharged in good condition.  Understands return precautions.  This chart was dictated using voice recognition software.  Despite best efforts to proofread,  errors can occur which can change the documentation meaning.         Final Clinical Impression(s) / ED Diagnoses Final diagnoses:  Nausea  Secondary hypertension    Rx / DC Orders ED Discharge Orders  Ordered    ondansetron  (ZOFRAN -ODT) 4 MG disintegrating tablet  Every 8 hours PRN        09/16/23 1813              Ruthe Cornet, DO 09/16/23 1815

## 2023-09-16 NOTE — ED Triage Notes (Signed)
 Bp at home 185/95ish  Denies chest pain,   Reports some nausea after eating  Feeling lightheaded   Took home BP med as normal

## 2023-09-16 NOTE — ED Notes (Signed)
 Discharge paperwork given and verbally understood.

## 2023-09-24 DIAGNOSIS — E663 Overweight: Secondary | ICD-10-CM | POA: Diagnosis not present

## 2023-09-24 DIAGNOSIS — E785 Hyperlipidemia, unspecified: Secondary | ICD-10-CM | POA: Diagnosis not present

## 2023-09-24 DIAGNOSIS — I10 Essential (primary) hypertension: Secondary | ICD-10-CM | POA: Diagnosis not present

## 2023-09-24 DIAGNOSIS — K5900 Constipation, unspecified: Secondary | ICD-10-CM | POA: Diagnosis not present

## 2023-09-24 DIAGNOSIS — G479 Sleep disorder, unspecified: Secondary | ICD-10-CM | POA: Diagnosis not present

## 2023-09-24 DIAGNOSIS — I700 Atherosclerosis of aorta: Secondary | ICD-10-CM | POA: Diagnosis not present

## 2023-09-24 DIAGNOSIS — I480 Paroxysmal atrial fibrillation: Secondary | ICD-10-CM | POA: Diagnosis not present

## 2023-09-26 NOTE — Progress Notes (Signed)
 Needs

## 2023-09-28 DIAGNOSIS — H1132 Conjunctival hemorrhage, left eye: Secondary | ICD-10-CM | POA: Diagnosis not present

## 2023-10-30 DIAGNOSIS — M25511 Pain in right shoulder: Secondary | ICD-10-CM | POA: Diagnosis not present

## 2023-12-21 DIAGNOSIS — R6884 Jaw pain: Secondary | ICD-10-CM | POA: Diagnosis not present

## 2023-12-21 DIAGNOSIS — K1120 Sialoadenitis, unspecified: Secondary | ICD-10-CM | POA: Diagnosis not present

## 2024-01-25 DIAGNOSIS — E663 Overweight: Secondary | ICD-10-CM | POA: Diagnosis not present

## 2024-01-25 DIAGNOSIS — I719 Aortic aneurysm of unspecified site, without rupture: Secondary | ICD-10-CM | POA: Diagnosis not present

## 2024-01-25 DIAGNOSIS — I5032 Chronic diastolic (congestive) heart failure: Secondary | ICD-10-CM | POA: Diagnosis not present

## 2024-01-25 DIAGNOSIS — K5900 Constipation, unspecified: Secondary | ICD-10-CM | POA: Diagnosis not present

## 2024-01-25 DIAGNOSIS — I10 Essential (primary) hypertension: Secondary | ICD-10-CM | POA: Diagnosis not present

## 2024-01-25 DIAGNOSIS — Z86711 Personal history of pulmonary embolism: Secondary | ICD-10-CM | POA: Diagnosis not present

## 2024-01-25 DIAGNOSIS — G4700 Insomnia, unspecified: Secondary | ICD-10-CM | POA: Diagnosis not present

## 2024-01-25 DIAGNOSIS — E785 Hyperlipidemia, unspecified: Secondary | ICD-10-CM | POA: Diagnosis not present

## 2024-01-25 DIAGNOSIS — I700 Atherosclerosis of aorta: Secondary | ICD-10-CM | POA: Diagnosis not present

## 2024-01-25 DIAGNOSIS — D6859 Other primary thrombophilia: Secondary | ICD-10-CM | POA: Diagnosis not present

## 2024-01-25 DIAGNOSIS — I480 Paroxysmal atrial fibrillation: Secondary | ICD-10-CM | POA: Diagnosis not present

## 2024-02-28 DIAGNOSIS — L570 Actinic keratosis: Secondary | ICD-10-CM | POA: Diagnosis not present

## 2024-02-28 DIAGNOSIS — L821 Other seborrheic keratosis: Secondary | ICD-10-CM | POA: Diagnosis not present

## 2024-02-28 DIAGNOSIS — D485 Neoplasm of uncertain behavior of skin: Secondary | ICD-10-CM | POA: Diagnosis not present

## 2024-02-28 DIAGNOSIS — D225 Melanocytic nevi of trunk: Secondary | ICD-10-CM | POA: Diagnosis not present

## 2024-07-18 DIAGNOSIS — R3914 Feeling of incomplete bladder emptying: Secondary | ICD-10-CM | POA: Diagnosis not present

## 2024-07-18 DIAGNOSIS — R351 Nocturia: Secondary | ICD-10-CM | POA: Diagnosis not present

## 2024-07-18 DIAGNOSIS — N400 Enlarged prostate without lower urinary tract symptoms: Secondary | ICD-10-CM | POA: Diagnosis not present

## 2024-07-18 DIAGNOSIS — R350 Frequency of micturition: Secondary | ICD-10-CM | POA: Diagnosis not present

## 2024-07-18 DIAGNOSIS — R3912 Poor urinary stream: Secondary | ICD-10-CM | POA: Diagnosis not present

## 2024-07-18 DIAGNOSIS — N401 Enlarged prostate with lower urinary tract symptoms: Secondary | ICD-10-CM | POA: Diagnosis not present

## 2024-07-18 DIAGNOSIS — D4100 Neoplasm of uncertain behavior of unspecified kidney: Secondary | ICD-10-CM | POA: Diagnosis not present

## 2024-07-31 DIAGNOSIS — D4100 Neoplasm of uncertain behavior of unspecified kidney: Secondary | ICD-10-CM | POA: Diagnosis not present

## 2024-07-31 DIAGNOSIS — N200 Calculus of kidney: Secondary | ICD-10-CM | POA: Diagnosis not present

## 2024-07-31 DIAGNOSIS — N2889 Other specified disorders of kidney and ureter: Secondary | ICD-10-CM | POA: Diagnosis not present

## 2024-08-12 DIAGNOSIS — R350 Frequency of micturition: Secondary | ICD-10-CM | POA: Diagnosis not present

## 2024-09-15 ENCOUNTER — Ambulatory Visit: Attending: Internal Medicine | Admitting: Internal Medicine

## 2024-09-15 ENCOUNTER — Encounter: Payer: Self-pay | Admitting: Internal Medicine

## 2024-09-15 VITALS — BP 112/70 | HR 57 | Ht 73.0 in | Wt 232.0 lb

## 2024-09-15 DIAGNOSIS — I2510 Atherosclerotic heart disease of native coronary artery without angina pectoris: Secondary | ICD-10-CM

## 2024-09-15 DIAGNOSIS — I77810 Thoracic aortic ectasia: Secondary | ICD-10-CM

## 2024-09-15 DIAGNOSIS — I4819 Other persistent atrial fibrillation: Secondary | ICD-10-CM | POA: Diagnosis not present

## 2024-09-15 DIAGNOSIS — I10 Essential (primary) hypertension: Secondary | ICD-10-CM | POA: Diagnosis not present

## 2024-09-15 DIAGNOSIS — F1090 Alcohol use, unspecified, uncomplicated: Secondary | ICD-10-CM | POA: Diagnosis not present

## 2024-09-15 NOTE — Patient Instructions (Addendum)
 Medication Instructions:  NO CHANGES  *If you need a refill on your cardiac medications before your next appointment, please call your pharmacy*  Testing/Procedures: Your physician has requested that you have an echocardiogram. Echocardiography is a painless test that uses sound waves to create images of your heart. It provides your doctor with information about the size and shape of your heart and how well your hearts chambers and valves are working. This procedure takes approximately one hour. There are no restrictions for this procedure. Please do NOT wear cologne, perfume, aftershave, or lotions (deodorant is allowed). Please arrive 15 minutes prior to your appointment time.  Please note: We ask at that you not bring children with you during ultrasound (echo/ vascular) testing. Due to room size and safety concerns, children are not allowed in the ultrasound rooms during exams. Our front office staff cannot provide observation of children in our lobby area while testing is being conducted. An adult accompanying a patient to their appointment will only be allowed in the ultrasound room at the discretion of the ultrasound technician under special circumstances. We apologize for any inconvenience.   Follow-Up: At [facility], you and your health needs are our priority.  As part of our continuing mission to provide you with exceptional heart care, our providers are all part of one team.  This team includes your primary Cardiologist (physician) and Advanced Practice Providers or APPs (Physician Assistants and Nurse Practitioners) who all work together to provide you with the care you need, when you need it.  Your next appointment:    12 months with Dr. Kriste   We recommend signing up for the patient portal called MyChart.  Sign up information is provided on this After Visit Summary.  MyChart is used to connect with patients for Virtual Visits (Telemedicine).  Patients are able to view  lab/test results, encounter notes, upcoming appointments, etc.  Non-urgent messages can be sent to your provider as well.   To learn more about what you can do with MyChart, go to [email or web address].   Other Instructions

## 2024-09-15 NOTE — Progress Notes (Signed)
 " Cardiology Office Note:  .   Date:  09/15/2024  ID:  Timothy Cooley, DOB [birth year], MRN [id number] PCP: Valentin Skates, DO  [facility] Providers Cardiologist:  Aleene Passe, MD (Inactive) Electrophysiologist:  OLE ONEIDA HOLTS, MD    History of Present Illness: .   Timothy Cooley is a 69 y.o. male Discussed the use of AI scribe software for clinical note transcription with the patient, who gave verbal consent to proceed.  History of Present Illness Timothy Cooley is a 69 year old male with atrial fibrillation, alcohol  use and hypertension who presents for an annual follow-up.  He has persistent atrial fibrillation status post pulmonary vein isolation ablation on Jan 16, 2022. He declines anticoagulation and instead takes aspirin  81 mg daily which was discussed at his EP appointment in 2023. He has no palpitations.  He has hypertension treated with losartan  100 mg and amlodipine 5 mg. Home blood pressures over the last month range from 137/71 to 150/80, usually lower in the afternoon. He takes his medications early in the morning.  He has coronary artery calcifications with a calcium  score of 848 and hyperlipidemia treated with atorvastatin  40 mg. He has no chest pain or shortness of breath.  He does not smoke and drinks alcohol  occasionally.  He has kidney cancer with an upcoming surgical visit to discuss partial nephrectomy, which will require coordination of his cardiovascular management around surgery.  He was previously able to play sports up into his 16s but has since had back problems which limit him.  He also has been limiting his alcohol  intake  ROS: No complaints  Studies Reviewed: SABRA   EKG Interpretation Date/Time:  Monday September 15 2024 08:00:20 EST Ventricular Rate:  57 PR Interval:  194 QRS Duration:  100 QT Interval:  430 QTC Calculation: 418 R Axis:   41  Text Interpretation: Sinus bradycardia When compared with ECG of 16-Sep-2023  16:46, Premature atrial complexes NO LONGER PRESENT Confirmed by Kriste Hicks [phone]) on 09/15/2024 8:05:38 AM    Results Radiology Coronary artery calcium  score: Calcium  score 848  Diagnostic Pharmacologic SPECT myocardial perfusion imaging (02/11/2021): Normal, low risk findings  Echocardiogram (01/24/2021): Ejection fraction 60-65%, indeterminate diastology, no hemodynamic valvular abnormalities, mildly dilated ascending aorta measuring 41 mm Risk Assessment/Calculations:    CHA2DS2-VASc Score = 3   This indicates a 3.2% annual risk of stroke. The patient's score is based upon: CHF History: 0 HTN History: 1 Diabetes History: 0 Stroke History: 0 Vascular Disease History: 1 Age Score: 1 Gender Score: 0            Physical Exam:   VS:  BP 112/70 (BP Location: Right Arm, Patient Position: Sitting, Cuff Size: Normal)   Pulse (!) 57   Ht 6' 1 (1.854 m)   Wt 232 lb (105.2 kg)   SpO2 95%   BMI 30.61 kg/m    Wt Readings from Last 3 Encounters:  09/15/24 232 lb (105.2 kg)  05/07/23 224 lb 9.6 oz (101.9 kg)  05/24/22 228 lb (103.4 kg)    GEN: Well nourished, well developed in no acute distress NECK: No JVD; No carotid bruits CARDIAC: RRR, no murmurs, no rubs, no gallops RESPIRATORY:  Clear to auscultation without rales, wheezing or rhonchi  ABDOMEN: Soft, non-tender, non-distended EXTREMITIES:  No edema; No deformity   ASSESSMENT AND PLAN: .    Assessment and Plan Assessment & Plan Persistent atrial fibrillation, status post ablation Persistent atrial fibrillation with previous ablation on May  8th, 2023, involving pulmonary vein isolation.  CHA2DS2-VASc: 3 (hypertension, vascular disease and age).  Currently in normal sinus rhythm. Not on anticoagulation due to personal preference and previous discussion with Dr. Cindie and continues to decline today. Continues on aspirin  81 mg daily. - Continue aspirin  81 mg daily  Atherosclerotic heart disease of native coronary  arteries with coronary artery calcification Coronary artery calcifications with a calcium  score of 848. No recent chest pain or shortness of breath reported. - Continue atorvastatin  40 mg daily - Continue aspirin  81 mg  Primary hypertension Blood pressure readings over the last month range from 137/71 to 150/80 mmHg at home. Currently on losartan  100 mg and amlodipine 5 mg daily. Blood pressure control appears adequate, but he reports skepticism about readings of 112/70 mmHg which was obtained in the office. - Continue losartan  100 mg daily - Continue amlodipine 5 mg daily - PCP managing - Advised to have blood pressure monitor calibrated  Mildly dilated ascending aorta Noted on echocardiogram in May 2022, measuring 41 mm. No hemodynamic valvular abnormalities reported. - Ordered repeat echocardiogram to assess aortic size  Alcohol  use Drinks wine with dinner. - Discussed decreasing alcohol  intake            Follow up: 1 year  Signed, Emeline FORBES Calender, DO  09/15/2024 8:33 AM    [facility] "

## 2024-09-25 ENCOUNTER — Telehealth: Payer: Self-pay | Admitting: Internal Medicine

## 2024-09-25 ENCOUNTER — Other Ambulatory Visit: Payer: Self-pay | Admitting: Urology

## 2024-09-25 NOTE — Telephone Encounter (Signed)
 He was seen by me on 09/15/2024 and noted that he was relatively active without any exertional chest pain or shortness of breath and able to achieve greater than 4 METS.  RCRI: 1 for coronary artery disease.  Patient is at low to intermediate perioperative risk of MACE for the anticipated intermediate risk procedure.  No further preop cardiac workup needed.   Thank you, Emeline Calender, DO

## 2024-09-25 NOTE — Telephone Encounter (Signed)
" ° °  Pre-operative Risk Assessment    Patient Name: Timothy Cooley  DOB: [birth year] MRN: [id number]      Request for Surgical Clearance    Procedure:  Left Robotic Partial Nephrectomy   Date of Surgery:  Clearance 12/03/24                                 Surgeon:  Dr. Lonni Han Surgeon's Group or Practice Name:  [facility]  Phone number:  [phone] Ext. [phone] Fax number:  [phone]   Type of Clearance Requested:   - Medical    Type of Anesthesia:  General    Additional requests/questions:  N/A  SignedChantal CHRISTELLA Penton   09/25/2024, 3:02 PM   "

## 2024-09-26 NOTE — Telephone Encounter (Signed)
"  ° °  Patient Name: Timothy Cooley  DOB: [birth year] MRN: [id number]  Primary Cardiologist: Aleene Passe, MD (Inactive)  Chart reviewed as part of pre-operative protocol coverage. Per Dr. Kriste: He was seen by me on 09/15/2024 and noted that he was relatively active without any exertional chest pain or shortness of breath and able to achieve greater than 4 METS. RCRI: 1 for coronary artery disease. Patient is at low to intermediate perioperative risk of MACE for the anticipated intermediate risk procedure. No further preop cardiac workup needed.  I will route this recommendation to the requesting party via Epic fax function and remove from pre-op pool.  Please call with questions.  Panagiota Perfetti E Elion Hocker, PA-C 09/26/2024, 8:57 AM  "

## 2024-10-07 ENCOUNTER — Ambulatory Visit (HOSPITAL_COMMUNITY)
Admission: RE | Admit: 2024-10-07 | Discharge: 2024-10-07 | Disposition: A | Discharge status: Home / Self Care | Payer: Self-pay | Source: Ambulatory Visit | Attending: Vascular Surgery | Admitting: Vascular Surgery

## 2024-10-07 ENCOUNTER — Ambulatory Visit: Payer: Self-pay | Admitting: Internal Medicine

## 2024-10-07 DIAGNOSIS — Z8673 Personal history of transient ischemic attack (TIA), and cerebral infarction without residual deficits: Secondary | ICD-10-CM | POA: Insufficient documentation

## 2024-10-07 DIAGNOSIS — I2510 Atherosclerotic heart disease of native coronary artery without angina pectoris: Secondary | ICD-10-CM | POA: Insufficient documentation

## 2024-10-07 DIAGNOSIS — I77810 Thoracic aortic ectasia: Secondary | ICD-10-CM | POA: Insufficient documentation

## 2024-10-07 DIAGNOSIS — E785 Hyperlipidemia, unspecified: Secondary | ICD-10-CM | POA: Insufficient documentation

## 2024-10-07 DIAGNOSIS — R600 Localized edema: Secondary | ICD-10-CM | POA: Diagnosis not present

## 2024-10-07 DIAGNOSIS — I4891 Unspecified atrial fibrillation: Secondary | ICD-10-CM | POA: Insufficient documentation

## 2024-10-07 DIAGNOSIS — I509 Heart failure, unspecified: Secondary | ICD-10-CM | POA: Insufficient documentation

## 2024-10-07 DIAGNOSIS — Z01810 Encounter for preprocedural cardiovascular examination: Secondary | ICD-10-CM | POA: Diagnosis not present

## 2024-10-07 DIAGNOSIS — Z01818 Encounter for other preprocedural examination: Secondary | ICD-10-CM | POA: Diagnosis present

## 2024-10-07 DIAGNOSIS — I110 Hypertensive heart disease with heart failure: Secondary | ICD-10-CM | POA: Diagnosis not present

## 2024-10-07 LAB — ECHOCARDIOGRAM COMPLETE
AR max vel: 3.11 cm2
AV Area VTI: 3.2 cm2
AV Area mean vel: 3.17 cm2
AV Mean grad: 4 mmHg
AV Peak grad: 9.7 mmHg
Ao pk vel: 1.56 m/s
Area-P 1/2: 3.02 cm2
S' Lateral: 2.73 cm

## 2024-12-03 ENCOUNTER — Ambulatory Visit (HOSPITAL_COMMUNITY): Admit: 2024-12-03 | Admitting: Urology
